# Patient Record
Sex: Female | Born: 1955
Health system: Southern US, Community
[De-identification: ages and names within clinical notes are randomized; demographics above are authoritative.]

## PROBLEM LIST (undated history)

## (undated) ENCOUNTER — Ambulatory Visit (HOSPITAL_COMMUNITY): Payer: Self-pay

## (undated) DIAGNOSIS — R569 Unspecified convulsions: Secondary | ICD-10-CM

## (undated) HISTORY — PX: TUBAL LIGATION: SHX77

## (undated) HISTORY — PX: SHOULDER ACROMIOPLASTY: SHX6093

## (undated) HISTORY — DX: Unspecified convulsions: R56.9

---

## 1999-05-03 ENCOUNTER — Emergency Department (HOSPITAL_COMMUNITY): Admission: EM | Admit: 1999-05-03 | Discharge: 1999-05-03 | Payer: Self-pay | Admitting: Emergency Medicine

## 1999-05-03 ENCOUNTER — Encounter: Payer: Self-pay | Admitting: Emergency Medicine

## 2000-07-28 ENCOUNTER — Encounter: Payer: Self-pay | Admitting: Orthopedic Surgery

## 2000-07-28 ENCOUNTER — Encounter: Admission: RE | Admit: 2000-07-28 | Discharge: 2000-07-28 | Payer: Self-pay | Admitting: Orthopedic Surgery

## 2000-08-23 ENCOUNTER — Ambulatory Visit (HOSPITAL_BASED_OUTPATIENT_CLINIC_OR_DEPARTMENT_OTHER): Admission: RE | Admit: 2000-08-23 | Discharge: 2000-08-23 | Payer: Self-pay | Admitting: Orthopedic Surgery

## 2001-12-19 ENCOUNTER — Encounter: Payer: Self-pay | Admitting: Family Medicine

## 2001-12-19 ENCOUNTER — Encounter: Admission: RE | Admit: 2001-12-19 | Discharge: 2001-12-19 | Payer: Self-pay | Admitting: Family Medicine

## 2003-01-21 ENCOUNTER — Encounter: Admission: RE | Admit: 2003-01-21 | Discharge: 2003-01-21 | Payer: Self-pay | Admitting: Family Medicine

## 2003-01-21 ENCOUNTER — Encounter: Payer: Self-pay | Admitting: Family Medicine

## 2004-02-09 ENCOUNTER — Other Ambulatory Visit: Admission: RE | Admit: 2004-02-09 | Discharge: 2004-02-09 | Payer: Self-pay | Admitting: Family Medicine

## 2004-02-20 ENCOUNTER — Encounter: Admission: RE | Admit: 2004-02-20 | Discharge: 2004-02-20 | Payer: Self-pay | Admitting: Family Medicine

## 2004-05-20 ENCOUNTER — Ambulatory Visit (HOSPITAL_COMMUNITY): Admission: RE | Admit: 2004-05-20 | Discharge: 2004-05-20 | Payer: Self-pay | Admitting: Family Medicine

## 2005-02-14 ENCOUNTER — Other Ambulatory Visit: Admission: RE | Admit: 2005-02-14 | Discharge: 2005-02-14 | Payer: Self-pay | Admitting: Family Medicine

## 2006-02-16 ENCOUNTER — Other Ambulatory Visit: Admission: RE | Admit: 2006-02-16 | Discharge: 2006-02-16 | Payer: Self-pay | Admitting: Family Medicine

## 2006-06-02 ENCOUNTER — Encounter: Admission: RE | Admit: 2006-06-02 | Discharge: 2006-06-02 | Payer: Self-pay | Admitting: Family Medicine

## 2009-04-14 ENCOUNTER — Encounter: Admission: RE | Admit: 2009-04-14 | Discharge: 2009-04-14 | Payer: Self-pay | Admitting: Family Medicine

## 2009-06-09 ENCOUNTER — Other Ambulatory Visit: Admission: RE | Admit: 2009-06-09 | Discharge: 2009-06-09 | Payer: Self-pay | Admitting: Family Medicine

## 2011-06-20 ENCOUNTER — Other Ambulatory Visit: Payer: Self-pay | Admitting: Family Medicine

## 2011-06-20 DIAGNOSIS — Z1231 Encounter for screening mammogram for malignant neoplasm of breast: Secondary | ICD-10-CM

## 2011-07-01 ENCOUNTER — Ambulatory Visit: Payer: Self-pay

## 2011-07-08 ENCOUNTER — Ambulatory Visit
Admission: RE | Admit: 2011-07-08 | Discharge: 2011-07-08 | Disposition: A | Discharge status: Home / Self Care | Payer: Commercial Indemnity | Source: Ambulatory Visit | Attending: Family Medicine | Admitting: Family Medicine

## 2011-07-08 DIAGNOSIS — Z1231 Encounter for screening mammogram for malignant neoplasm of breast: Secondary | ICD-10-CM

## 2013-02-11 ENCOUNTER — Other Ambulatory Visit: Payer: Self-pay

## 2013-02-11 DIAGNOSIS — Z1231 Encounter for screening mammogram for malignant neoplasm of breast: Secondary | ICD-10-CM

## 2013-03-12 ENCOUNTER — Ambulatory Visit
Admission: RE | Admit: 2013-03-12 | Discharge: 2013-03-12 | Disposition: A | Discharge status: Home / Self Care | Payer: BC Managed Care – PPO | Source: Ambulatory Visit

## 2013-03-12 DIAGNOSIS — Z1231 Encounter for screening mammogram for malignant neoplasm of breast: Secondary | ICD-10-CM

## 2013-04-18 ENCOUNTER — Other Ambulatory Visit (HOSPITAL_COMMUNITY)
Admission: RE | Admit: 2013-04-18 | Discharge: 2013-04-18 | Disposition: A | Discharge status: Home / Self Care | Payer: BC Managed Care – PPO | Source: Ambulatory Visit | Attending: Family Medicine | Admitting: Family Medicine

## 2013-04-18 ENCOUNTER — Other Ambulatory Visit: Payer: Self-pay | Admitting: Family Medicine

## 2013-04-18 DIAGNOSIS — Z113 Encounter for screening for infections with a predominantly sexual mode of transmission: Secondary | ICD-10-CM | POA: Insufficient documentation

## 2013-04-18 DIAGNOSIS — Z0000 Encounter for general adult medical examination without abnormal findings: Secondary | ICD-10-CM | POA: Insufficient documentation

## 2013-04-18 DIAGNOSIS — R87810 Cervical high risk human papillomavirus (HPV) DNA test positive: Secondary | ICD-10-CM | POA: Insufficient documentation

## 2013-04-18 DIAGNOSIS — Z1151 Encounter for screening for human papillomavirus (HPV): Secondary | ICD-10-CM | POA: Insufficient documentation

## 2013-10-23 ENCOUNTER — Ambulatory Visit (INDEPENDENT_AMBULATORY_CARE_PROVIDER_SITE_OTHER): Payer: BC Managed Care – PPO

## 2013-10-23 VITALS — BP 150/80 | HR 64 | Resp 12 | Ht 60.0 in | Wt 157.0 lb

## 2013-10-23 DIAGNOSIS — R52 Pain, unspecified: Secondary | ICD-10-CM

## 2013-10-23 DIAGNOSIS — M2010 Hallux valgus (acquired), unspecified foot: Secondary | ICD-10-CM

## 2013-10-23 DIAGNOSIS — M779 Enthesopathy, unspecified: Secondary | ICD-10-CM

## 2013-10-23 DIAGNOSIS — M205X2 Other deformities of toe(s) (acquired), left foot: Secondary | ICD-10-CM

## 2013-10-23 DIAGNOSIS — M205X9 Other deformities of toe(s) (acquired), unspecified foot: Secondary | ICD-10-CM

## 2013-10-23 MED ORDER — MELOXICAM 15 MG PO TABS: 15.0000 mg | ORAL_TABLET | Freq: Every day | ORAL | Status: DC

## 2013-10-23 NOTE — Patient Instructions (Signed)

## 2013-10-23 NOTE — Progress Notes (Signed)
   Subjective:    Patient ID: Laura Baldwin, female    DOB: [birth year], 58 y.o.   MRN: [id number]  Foot Pain This is a new problem. The current episode started more than 1 month ago. The problem occurs constantly. The problem has been unchanged. The symptoms are aggravated by walking and standing. She has tried NSAIDs and ice for the symptoms. The treatment provided mild relief.      Review of Systems  Neurological: Positive for seizures.  All other systems reviewed and are negative.       Objective:   Physical Exam Lower extremity objective findings as follows vascular status is intact with pedal pulses palpable DP and PT posterior were for bilateral Refill time 3 seconds all digits skin temperature warm turgor normal no edema rubor pallor or varicosities noted. Neurologically epicritic and proprioceptive sensations intact and symmetric bilateral normal plantar response and DTRs. Dermatologically skin color pigment normal hair growth absent nails normal trophic no open wounds or ulcerations noted. On orthopedic biomechanical exam rectus foot type with mild promontory changes noted on weightbearing there is a slight hallux abductus HAV deformity noted bilateral left more so than right. There is some decreased range of motion dorsiflexion plantar flexion at the left first MTP area as compared to the right mild flexible digital contractures lesser digits are noted. X-rays reveal some asymmetric joint space narrowing first MTP joint right sesamoid position 4 mild spurring at the first metatarsal neck and base of the phalanx first metatarsal bone toe joint left. Slightly elevated I am angle approximately 11-12. Clinically there is pain on palpation around first MTP joint although minimal more severe with prolonged walking activities certain shoes.       Assessment & Plan:  Assessment this time is capsulitis first MTP area left foot secondary to functional hallux limitus and promontory  changes of the foot. Patient is mild early HAV deformity again with capsulitis symptoms in pain with activities. Plan at this time patient placed a dancers. The fascial strapping. Also a prescription for Mobic is dispensed 15 mg daily. Also suggested ice to the affected area and wide accommodative shoes as tolerated recheck in 2-3 weeks for reevaluation patient may be candidate for orthoses possible orthotic with kinetic wedge type modification.  Alvan Dameichard Maryln Eastham DPM

## 2013-11-01 ENCOUNTER — Other Ambulatory Visit: Payer: Self-pay | Admitting: Obstetrics and Gynecology

## 2013-11-01 ENCOUNTER — Other Ambulatory Visit (HOSPITAL_COMMUNITY)
Admission: RE | Admit: 2013-11-01 | Discharge: 2013-11-01 | Disposition: A | Discharge status: Home / Self Care | Payer: BC Managed Care – PPO | Source: Ambulatory Visit | Attending: Obstetrics and Gynecology | Admitting: Obstetrics and Gynecology

## 2013-11-01 DIAGNOSIS — Z1151 Encounter for screening for human papillomavirus (HPV): Secondary | ICD-10-CM | POA: Insufficient documentation

## 2013-11-01 DIAGNOSIS — R87810 Cervical high risk human papillomavirus (HPV) DNA test positive: Secondary | ICD-10-CM | POA: Insufficient documentation

## 2013-11-01 DIAGNOSIS — Z124 Encounter for screening for malignant neoplasm of cervix: Secondary | ICD-10-CM | POA: Insufficient documentation

## 2013-11-22 ENCOUNTER — Ambulatory Visit (INDEPENDENT_AMBULATORY_CARE_PROVIDER_SITE_OTHER): Payer: BC Managed Care – PPO

## 2013-11-22 ENCOUNTER — Ambulatory Visit: Payer: BC Managed Care – PPO

## 2013-11-22 VITALS — BP 140/83 | HR 96 | Resp 16

## 2013-11-22 DIAGNOSIS — M2010 Hallux valgus (acquired), unspecified foot: Secondary | ICD-10-CM

## 2013-11-22 DIAGNOSIS — M205X9 Other deformities of toe(s) (acquired), unspecified foot: Secondary | ICD-10-CM

## 2013-11-22 DIAGNOSIS — M205X2 Other deformities of toe(s) (acquired), left foot: Secondary | ICD-10-CM

## 2013-11-22 DIAGNOSIS — R52 Pain, unspecified: Secondary | ICD-10-CM

## 2013-11-22 DIAGNOSIS — M779 Enthesopathy, unspecified: Secondary | ICD-10-CM

## 2013-11-22 MED ORDER — MELOXICAM 15 MG PO TABS: 15.0000 mg | ORAL_TABLET | Freq: Every day | ORAL | Status: AC

## 2013-11-22 NOTE — Patient Instructions (Signed)

## 2013-11-22 NOTE — Progress Notes (Signed)
   Subjective:    Patient ID: Laura LivingsRebecca G. Ladona Ridgelaylor, female    DOB: [birth year], 58 y.o.   MRN: [id number]  HPI Comments: "Well, got a little better, but I still feel it some"  Follow up capsulitis 1st MPJ left foot  Foot Pain      Review of Systems no new changes or finding     Objective:   Physical Exam Neurovascular status is intact with pedal pulses palpable DP postal for PT plus one over 4 Refill time 3 seconds all digits there is still some tenderness of first MTP joint left foot have good range of motion noted slight hallux limitus is identified on dorsiflexion patient had improvement with the Mobic at this time I do feel that would benefit from a semi-functional or least over OTC orthotic patient is and I did not want this time will select an OTC power step orthotic as a trolley we can adjust and modify these the future as needed may consider some pocketing of the first metatarsal the future based on the       Assessment & Plan:  Assessment capsulitis secondary to hallux limitus/hallux rigidus deformity with HAV and osteoarthropathy first MTP area left foot plan at this time patient will continue with Mobic new prescription is forwarded to the pharmacy also use an as-needed basis for pain patient will also initiate power step OTC orthotic as instructed Balen Woolum instructions are given followup in one to 2 months for adjustments if needed  Alvan Dameichard Tekla Malachowski DPM

## 2014-01-21 ENCOUNTER — Ambulatory Visit (INDEPENDENT_AMBULATORY_CARE_PROVIDER_SITE_OTHER): Payer: BC Managed Care – PPO

## 2014-01-21 VITALS — BP 135/77 | HR 73 | Resp 16

## 2014-01-21 DIAGNOSIS — M205X9 Other deformities of toe(s) (acquired), unspecified foot: Secondary | ICD-10-CM

## 2014-01-21 DIAGNOSIS — M205X2 Other deformities of toe(s) (acquired), left foot: Secondary | ICD-10-CM

## 2014-01-21 DIAGNOSIS — M779 Enthesopathy, unspecified: Secondary | ICD-10-CM

## 2014-01-21 NOTE — Patient Instructions (Signed)
WEARING INSTRUCTIONS FOR ORTHOTICS  Don't expect to be comfortable wearing your orthotic devices for the first time.  Like eyeglasses, you may be aware of them as time passes, they will not be uncomfortable and you will enjoy wearing them.  FOLLOW THESE INSTRUCTIONS EXACTLY!  1. Wear your orthotic devices for:       Not more than 1 hour the first day.       Not more than 2 hours the second day.       Not more than 3 hours the third day and so on.        Or wear them for as long as they feel comfortable.       If you experience discomfort in your feet or legs take them out.  When feet & legs feel       better, put them back in.  You do need to be consistent and wear them a little        everyday. 2.   If at any time the orthotic devices become acutely uncomfortable before the       time for that particular day, STOP WEARING THEM. 3.   On the next day, do not increase the wearing time. 4.   Subsequently, increase the wearing time by 15-30 minutes only if comfortable to do       so. 5.   You will be seen by your doctor about 2-4 weeks after you receive your orthotic       devices, at which time you will probably be wearing your devices comfortably        for about 8 hours or more a day. 6.   Some patients occasionally report mild aches or discomfort in other parts of the of       body such as the knees, hips or back after 3 or 4 consecutive hours of wear.  If this       is the case with you, do not extend your wearing time.  Instead, cut it back an hour or       two.  In all likelihood, these symptoms will disappear in a short period of time as your       body posture realigns itself and functions more efficiently. 7.   It is possible that your orthotic device may require some small changes or adjustment       to improve their function or make them more comfortable.   This is usually not done       before one to three months have elapsed.  These adjustments are made in        accordance  with the changed position your feet are assuming as a result of       improved biomechanical function. 8.   In women's shoes, it's not unusual for your heel to slip out of the shoe, particularly if       they are step-in-shoes.  If this is the case, try other shoes or other styles.  Try to       purchase shoes which have deeper heal seats or higher heel counters. 9.   Squeaking of orthotics devices in the shoes is due to the movement of the devices       when they are functioning normally.  To eliminate squeaking, simply dust some       baby powder into your shoes before inserting the devices.  If this does not work,          apply soap or wax to the edges of the orthotic devices or put a tissue into the shoes. 10. It is important that you follow these directions explicitly.  Failure to do so will simply       prolong the adjustment period or create problems which are easily avoided.  It makes       no difference if you are wearing your orthotic devices for only a few hours after        several months, so long as you are wearing them comfortably for those hours. 11. If you have any questions or complaints, contact our office.  We have no way of       knowing about your problems unless you tell us.  If we do not hear from you, we will       assume that you are proceeding well.  Maintain use orthotics at some time pain or symptomology becomes worse followup for further evaluation may need more invasive or surgical options

## 2014-01-21 NOTE — Progress Notes (Signed)
   Subjective:    Patient ID: Laura LivingsRebecca G. Ladona Baldwin, female    DOB: [birth year], 58 y.o.   MRN: [id number]  HPI Comments: "It is better, but not where I would like it to be"  Follow up capsulitis 1st MPJ left foot  Foot Pain      Review of Systems no new systemic changes or findings noted     Objective:   Physical Exam Neurovascular status is intact pedal pulses are palpable epicritic and proprioceptive sensations intact Norplant response and DTRs patient continues to have some limited range of motion of the first MTP joint cover the power step orthotics have been beneficial has lacerated has less pain on palpation or range of motion there is some dorsal spurring first metatarsal consistent with previous evaluations however has minimal discomfort for the most part functions well which is wearing a good shoe in the orthoses. At this time suggested continue monitoring could be months to be years to the point where the symptoms improve increase or 4 strain to consider surgical or invasive options.       Assessment & Plan:  Assessment hallux limitus/rigidus deformity with capsulitis first MTP area left foot plan at this time continue with orthotics maintain good supportive athletic or walking shoes at all times if symptoms persist or worsen over time may be candidate for surgical intervention tylectomy or osteotomy with cheilectomy first MTP area left foot. Maintain orthotics at all times with instructions be given at this time  Alvan Dameichard Malinda Mayden DPM

## 2014-04-24 ENCOUNTER — Other Ambulatory Visit (HOSPITAL_COMMUNITY)
Admission: RE | Admit: 2014-04-24 | Discharge: 2014-04-24 | Disposition: A | Discharge status: Home / Self Care | Payer: BC Managed Care – PPO | Source: Ambulatory Visit | Attending: Obstetrics and Gynecology | Admitting: Obstetrics and Gynecology

## 2014-04-24 ENCOUNTER — Other Ambulatory Visit: Payer: Self-pay | Admitting: Obstetrics and Gynecology

## 2014-04-24 DIAGNOSIS — Z124 Encounter for screening for malignant neoplasm of cervix: Secondary | ICD-10-CM | POA: Insufficient documentation

## 2014-04-24 DIAGNOSIS — Z1151 Encounter for screening for human papillomavirus (HPV): Secondary | ICD-10-CM | POA: Insufficient documentation

## 2014-04-24 DIAGNOSIS — R87810 Cervical high risk human papillomavirus (HPV) DNA test positive: Secondary | ICD-10-CM | POA: Insufficient documentation

## 2014-04-28 LAB — CYTOLOGY - PAP

## 2014-05-27 ENCOUNTER — Other Ambulatory Visit: Payer: Self-pay | Admitting: Obstetrics and Gynecology

## 2014-05-30 ENCOUNTER — Other Ambulatory Visit: Payer: Self-pay

## 2014-05-30 DIAGNOSIS — Z1231 Encounter for screening mammogram for malignant neoplasm of breast: Secondary | ICD-10-CM

## 2014-06-27 ENCOUNTER — Ambulatory Visit
Admission: RE | Admit: 2014-06-27 | Discharge: 2014-06-27 | Disposition: A | Discharge status: Home / Self Care | Payer: BC Managed Care – PPO | Source: Ambulatory Visit

## 2014-06-27 DIAGNOSIS — Z1231 Encounter for screening mammogram for malignant neoplasm of breast: Secondary | ICD-10-CM

## 2015-04-24 ENCOUNTER — Other Ambulatory Visit: Payer: Self-pay | Admitting: Obstetrics and Gynecology

## 2015-04-24 ENCOUNTER — Other Ambulatory Visit (HOSPITAL_COMMUNITY)
Admission: RE | Admit: 2015-04-24 | Discharge: 2015-04-24 | Disposition: A | Discharge status: Home / Self Care | Payer: BLUE CROSS/BLUE SHIELD | Source: Ambulatory Visit | Attending: Obstetrics and Gynecology | Admitting: Obstetrics and Gynecology

## 2015-04-24 DIAGNOSIS — R87810 Cervical high risk human papillomavirus (HPV) DNA test positive: Secondary | ICD-10-CM | POA: Insufficient documentation

## 2015-04-24 DIAGNOSIS — Z1151 Encounter for screening for human papillomavirus (HPV): Secondary | ICD-10-CM | POA: Diagnosis present

## 2015-04-24 DIAGNOSIS — Z01411 Encounter for gynecological examination (general) (routine) with abnormal findings: Secondary | ICD-10-CM | POA: Diagnosis present

## 2015-04-27 LAB — CYTOLOGY - PAP

## 2015-10-29 ENCOUNTER — Other Ambulatory Visit (HOSPITAL_COMMUNITY)
Admission: RE | Admit: 2015-10-29 | Discharge: 2015-10-29 | Disposition: A | Discharge status: Home / Self Care | Payer: BLUE CROSS/BLUE SHIELD | Source: Ambulatory Visit | Attending: Obstetrics and Gynecology | Admitting: Obstetrics and Gynecology

## 2015-10-29 ENCOUNTER — Other Ambulatory Visit: Payer: Self-pay | Admitting: Obstetrics and Gynecology

## 2015-10-29 DIAGNOSIS — Z1151 Encounter for screening for human papillomavirus (HPV): Secondary | ICD-10-CM | POA: Diagnosis not present

## 2015-10-29 DIAGNOSIS — Z01411 Encounter for gynecological examination (general) (routine) with abnormal findings: Secondary | ICD-10-CM | POA: Insufficient documentation

## 2015-10-30 LAB — CYTOLOGY - PAP

## 2016-02-23 ENCOUNTER — Other Ambulatory Visit: Payer: Self-pay | Admitting: Obstetrics and Gynecology

## 2016-03-02 ENCOUNTER — Other Ambulatory Visit: Payer: Self-pay | Admitting: Obstetrics and Gynecology

## 2016-03-02 DIAGNOSIS — Z1231 Encounter for screening mammogram for malignant neoplasm of breast: Secondary | ICD-10-CM

## 2016-03-31 ENCOUNTER — Ambulatory Visit
Admission: RE | Admit: 2016-03-31 | Discharge: 2016-03-31 | Disposition: A | Discharge status: Home / Self Care | Payer: BLUE CROSS/BLUE SHIELD | Source: Ambulatory Visit | Attending: Obstetrics and Gynecology | Admitting: Obstetrics and Gynecology

## 2016-03-31 DIAGNOSIS — Z1231 Encounter for screening mammogram for malignant neoplasm of breast: Secondary | ICD-10-CM

## 2016-05-06 ENCOUNTER — Other Ambulatory Visit: Payer: Self-pay | Admitting: Obstetrics and Gynecology

## 2016-05-06 ENCOUNTER — Other Ambulatory Visit (HOSPITAL_COMMUNITY)
Admission: RE | Admit: 2016-05-06 | Discharge: 2016-05-06 | Disposition: A | Discharge status: Home / Self Care | Payer: BLUE CROSS/BLUE SHIELD | Source: Ambulatory Visit | Attending: Obstetrics and Gynecology | Admitting: Obstetrics and Gynecology

## 2016-05-06 DIAGNOSIS — Z1151 Encounter for screening for human papillomavirus (HPV): Secondary | ICD-10-CM | POA: Diagnosis present

## 2016-05-06 DIAGNOSIS — Z01419 Encounter for gynecological examination (general) (routine) without abnormal findings: Secondary | ICD-10-CM | POA: Diagnosis present

## 2016-05-10 LAB — CYTOLOGY - PAP

## 2017-05-08 ENCOUNTER — Other Ambulatory Visit: Payer: Self-pay | Admitting: Obstetrics and Gynecology

## 2017-05-08 ENCOUNTER — Other Ambulatory Visit (HOSPITAL_COMMUNITY)
Admission: RE | Admit: 2017-05-08 | Discharge: 2017-05-08 | Disposition: A | Discharge status: Home / Self Care | Payer: BLUE CROSS/BLUE SHIELD | Source: Ambulatory Visit | Attending: Obstetrics and Gynecology | Admitting: Obstetrics and Gynecology

## 2017-05-08 DIAGNOSIS — M8580 Other specified disorders of bone density and structure, unspecified site: Secondary | ICD-10-CM | POA: Diagnosis not present

## 2017-05-08 DIAGNOSIS — M810 Age-related osteoporosis without current pathological fracture: Secondary | ICD-10-CM | POA: Diagnosis not present

## 2017-05-08 DIAGNOSIS — Z124 Encounter for screening for malignant neoplasm of cervix: Secondary | ICD-10-CM | POA: Insufficient documentation

## 2017-05-08 DIAGNOSIS — Z01419 Encounter for gynecological examination (general) (routine) without abnormal findings: Secondary | ICD-10-CM | POA: Diagnosis not present

## 2017-05-08 DIAGNOSIS — Z01411 Encounter for gynecological examination (general) (routine) with abnormal findings: Secondary | ICD-10-CM | POA: Diagnosis not present

## 2017-05-10 LAB — CYTOLOGY - PAP
Diagnosis: UNDETERMINED — AB
HPV 16/18/45 genotyping: POSITIVE — AB
HPV: DETECTED — AB

## 2017-06-05 ENCOUNTER — Other Ambulatory Visit: Payer: Self-pay | Admitting: Obstetrics and Gynecology

## 2017-06-05 DIAGNOSIS — Z1231 Encounter for screening mammogram for malignant neoplasm of breast: Secondary | ICD-10-CM

## 2017-06-16 DIAGNOSIS — Z23 Encounter for immunization: Secondary | ICD-10-CM | POA: Diagnosis not present

## 2017-06-16 DIAGNOSIS — E785 Hyperlipidemia, unspecified: Secondary | ICD-10-CM | POA: Diagnosis not present

## 2017-06-16 DIAGNOSIS — G40909 Epilepsy, unspecified, not intractable, without status epilepticus: Secondary | ICD-10-CM | POA: Diagnosis not present

## 2017-06-16 DIAGNOSIS — E559 Vitamin D deficiency, unspecified: Secondary | ICD-10-CM | POA: Diagnosis not present

## 2017-07-20 ENCOUNTER — Other Ambulatory Visit: Payer: Self-pay | Admitting: Obstetrics and Gynecology

## 2017-07-20 ENCOUNTER — Ambulatory Visit
Admission: RE | Admit: 2017-07-20 | Discharge: 2017-07-20 | Disposition: A | Discharge status: Home / Self Care | Payer: BLUE CROSS/BLUE SHIELD | Source: Ambulatory Visit | Attending: Obstetrics and Gynecology | Admitting: Obstetrics and Gynecology

## 2017-07-20 DIAGNOSIS — R87610 Atypical squamous cells of undetermined significance on cytologic smear of cervix (ASC-US): Secondary | ICD-10-CM | POA: Diagnosis not present

## 2017-07-20 DIAGNOSIS — A630 Anogenital (venereal) warts: Secondary | ICD-10-CM | POA: Diagnosis not present

## 2017-07-20 DIAGNOSIS — Z1231 Encounter for screening mammogram for malignant neoplasm of breast: Secondary | ICD-10-CM

## 2017-08-25 ENCOUNTER — Other Ambulatory Visit: Payer: Self-pay | Admitting: Obstetrics and Gynecology

## 2017-08-25 DIAGNOSIS — R87810 Cervical high risk human papillomavirus (HPV) DNA test positive: Secondary | ICD-10-CM | POA: Diagnosis not present

## 2017-08-25 DIAGNOSIS — N888 Other specified noninflammatory disorders of cervix uteri: Secondary | ICD-10-CM | POA: Diagnosis not present

## 2017-10-02 DIAGNOSIS — N898 Other specified noninflammatory disorders of vagina: Secondary | ICD-10-CM | POA: Diagnosis not present

## 2017-10-02 DIAGNOSIS — Z9889 Other specified postprocedural states: Secondary | ICD-10-CM | POA: Diagnosis not present

## 2018-02-22 DIAGNOSIS — N3001 Acute cystitis with hematuria: Secondary | ICD-10-CM | POA: Diagnosis not present

## 2018-05-08 ENCOUNTER — Other Ambulatory Visit: Payer: Self-pay | Admitting: Obstetrics and Gynecology

## 2018-05-08 DIAGNOSIS — Z1231 Encounter for screening mammogram for malignant neoplasm of breast: Secondary | ICD-10-CM

## 2018-06-22 DIAGNOSIS — E785 Hyperlipidemia, unspecified: Secondary | ICD-10-CM | POA: Diagnosis not present

## 2018-06-22 DIAGNOSIS — Z23 Encounter for immunization: Secondary | ICD-10-CM | POA: Diagnosis not present

## 2018-06-22 DIAGNOSIS — G40909 Epilepsy, unspecified, not intractable, without status epilepticus: Secondary | ICD-10-CM | POA: Diagnosis not present

## 2018-06-22 DIAGNOSIS — E559 Vitamin D deficiency, unspecified: Secondary | ICD-10-CM | POA: Diagnosis not present

## 2018-08-06 ENCOUNTER — Other Ambulatory Visit (HOSPITAL_COMMUNITY)
Admission: RE | Admit: 2018-08-06 | Discharge: 2018-08-06 | Disposition: A | Discharge status: Home / Self Care | Payer: BLUE CROSS/BLUE SHIELD | Source: Ambulatory Visit | Attending: Obstetrics and Gynecology | Admitting: Obstetrics and Gynecology

## 2018-08-06 ENCOUNTER — Ambulatory Visit
Admission: RE | Admit: 2018-08-06 | Discharge: 2018-08-06 | Disposition: A | Discharge status: Home / Self Care | Payer: BLUE CROSS/BLUE SHIELD | Source: Ambulatory Visit | Attending: Obstetrics and Gynecology | Admitting: Obstetrics and Gynecology

## 2018-08-06 ENCOUNTER — Other Ambulatory Visit: Payer: Self-pay | Admitting: Obstetrics and Gynecology

## 2018-08-06 DIAGNOSIS — R32 Unspecified urinary incontinence: Secondary | ICD-10-CM | POA: Diagnosis not present

## 2018-08-06 DIAGNOSIS — R87810 Cervical high risk human papillomavirus (HPV) DNA test positive: Secondary | ICD-10-CM | POA: Diagnosis not present

## 2018-08-06 DIAGNOSIS — Z1231 Encounter for screening mammogram for malignant neoplasm of breast: Secondary | ICD-10-CM | POA: Diagnosis not present

## 2018-08-06 DIAGNOSIS — Z01411 Encounter for gynecological examination (general) (routine) with abnormal findings: Secondary | ICD-10-CM | POA: Diagnosis not present

## 2018-08-06 DIAGNOSIS — R300 Dysuria: Secondary | ICD-10-CM | POA: Diagnosis not present

## 2018-08-06 DIAGNOSIS — Z113 Encounter for screening for infections with a predominantly sexual mode of transmission: Secondary | ICD-10-CM | POA: Diagnosis not present

## 2018-08-06 DIAGNOSIS — A6000 Herpesviral infection of urogenital system, unspecified: Secondary | ICD-10-CM | POA: Diagnosis not present

## 2018-08-08 LAB — CYTOLOGY - PAP
Adequacy: ABSENT — AB
Chlamydia: NEGATIVE
HPV 16/18/45 genotyping: POSITIVE — AB
HPV: DETECTED — AB
Neisseria Gonorrhea: NEGATIVE

## 2019-06-21 DIAGNOSIS — Z23 Encounter for immunization: Secondary | ICD-10-CM | POA: Diagnosis not present

## 2019-06-21 DIAGNOSIS — G40909 Epilepsy, unspecified, not intractable, without status epilepticus: Secondary | ICD-10-CM | POA: Diagnosis not present

## 2019-06-21 DIAGNOSIS — E559 Vitamin D deficiency, unspecified: Secondary | ICD-10-CM | POA: Diagnosis not present

## 2019-06-21 DIAGNOSIS — E785 Hyperlipidemia, unspecified: Secondary | ICD-10-CM | POA: Diagnosis not present

## 2019-10-03 DIAGNOSIS — R896 Abnormal cytological findings in specimens from other organs, systems and tissues: Secondary | ICD-10-CM | POA: Diagnosis not present

## 2019-10-03 DIAGNOSIS — Z01411 Encounter for gynecological examination (general) (routine) with abnormal findings: Secondary | ICD-10-CM | POA: Diagnosis not present

## 2019-10-03 DIAGNOSIS — R87612 Low grade squamous intraepithelial lesion on cytologic smear of cervix (LGSIL): Secondary | ICD-10-CM | POA: Diagnosis not present

## 2019-10-03 DIAGNOSIS — R87810 Cervical high risk human papillomavirus (HPV) DNA test positive: Secondary | ICD-10-CM | POA: Diagnosis not present

## 2019-10-04 ENCOUNTER — Other Ambulatory Visit: Payer: Self-pay | Admitting: Obstetrics and Gynecology

## 2019-10-04 DIAGNOSIS — Z1231 Encounter for screening mammogram for malignant neoplasm of breast: Secondary | ICD-10-CM

## 2019-10-04 DIAGNOSIS — M810 Age-related osteoporosis without current pathological fracture: Secondary | ICD-10-CM

## 2019-11-06 ENCOUNTER — Other Ambulatory Visit: Payer: Self-pay | Admitting: Obstetrics and Gynecology

## 2019-11-06 DIAGNOSIS — N888 Other specified noninflammatory disorders of cervix uteri: Secondary | ICD-10-CM | POA: Diagnosis not present

## 2019-11-06 DIAGNOSIS — N870 Mild cervical dysplasia: Secondary | ICD-10-CM | POA: Diagnosis not present

## 2019-11-06 DIAGNOSIS — R87810 Cervical high risk human papillomavirus (HPV) DNA test positive: Secondary | ICD-10-CM | POA: Diagnosis not present

## 2019-11-06 DIAGNOSIS — R896 Abnormal cytological findings in specimens from other organs, systems and tissues: Secondary | ICD-10-CM | POA: Diagnosis not present

## 2019-12-20 ENCOUNTER — Ambulatory Visit
Admission: RE | Admit: 2019-12-20 | Discharge: 2019-12-20 | Disposition: A | Discharge status: Home / Self Care | Payer: BC Managed Care – PPO | Source: Ambulatory Visit | Attending: Obstetrics and Gynecology | Admitting: Obstetrics and Gynecology

## 2019-12-20 ENCOUNTER — Other Ambulatory Visit: Payer: Self-pay

## 2019-12-20 DIAGNOSIS — Z780 Asymptomatic menopausal state: Secondary | ICD-10-CM | POA: Diagnosis not present

## 2019-12-20 DIAGNOSIS — Z1231 Encounter for screening mammogram for malignant neoplasm of breast: Secondary | ICD-10-CM | POA: Diagnosis not present

## 2019-12-20 DIAGNOSIS — M810 Age-related osteoporosis without current pathological fracture: Secondary | ICD-10-CM | POA: Diagnosis not present

## 2019-12-20 DIAGNOSIS — M8588 Other specified disorders of bone density and structure, other site: Secondary | ICD-10-CM | POA: Diagnosis not present

## 2020-06-10 DIAGNOSIS — E559 Vitamin D deficiency, unspecified: Secondary | ICD-10-CM | POA: Diagnosis not present

## 2020-06-10 DIAGNOSIS — R69 Illness, unspecified: Secondary | ICD-10-CM | POA: Diagnosis not present

## 2020-06-10 DIAGNOSIS — E785 Hyperlipidemia, unspecified: Secondary | ICD-10-CM | POA: Diagnosis not present

## 2020-06-10 DIAGNOSIS — G40909 Epilepsy, unspecified, not intractable, without status epilepticus: Secondary | ICD-10-CM | POA: Diagnosis not present

## 2020-06-10 DIAGNOSIS — Z23 Encounter for immunization: Secondary | ICD-10-CM | POA: Diagnosis not present

## 2020-09-21 DIAGNOSIS — R519 Headache, unspecified: Secondary | ICD-10-CM | POA: Diagnosis not present

## 2020-09-21 DIAGNOSIS — R509 Fever, unspecified: Secondary | ICD-10-CM | POA: Diagnosis not present

## 2020-09-21 DIAGNOSIS — B349 Viral infection, unspecified: Secondary | ICD-10-CM | POA: Diagnosis not present

## 2020-09-21 DIAGNOSIS — R059 Cough, unspecified: Secondary | ICD-10-CM | POA: Diagnosis not present

## 2020-09-21 DIAGNOSIS — R5383 Other fatigue: Secondary | ICD-10-CM | POA: Diagnosis not present

## 2020-09-21 DIAGNOSIS — J329 Chronic sinusitis, unspecified: Secondary | ICD-10-CM | POA: Diagnosis not present

## 2020-09-21 DIAGNOSIS — H9201 Otalgia, right ear: Secondary | ICD-10-CM | POA: Diagnosis not present

## 2020-09-21 DIAGNOSIS — H9202 Otalgia, left ear: Secondary | ICD-10-CM | POA: Diagnosis not present

## 2020-09-21 DIAGNOSIS — R6883 Chills (without fever): Secondary | ICD-10-CM | POA: Diagnosis not present

## 2020-09-21 DIAGNOSIS — H6982 Other specified disorders of Eustachian tube, left ear: Secondary | ICD-10-CM | POA: Diagnosis not present

## 2020-09-21 DIAGNOSIS — R52 Pain, unspecified: Secondary | ICD-10-CM | POA: Diagnosis not present

## 2020-09-24 ENCOUNTER — Encounter (HOSPITAL_COMMUNITY): Payer: Self-pay

## 2020-09-24 ENCOUNTER — Other Ambulatory Visit: Payer: Self-pay

## 2020-09-24 ENCOUNTER — Emergency Department (HOSPITAL_COMMUNITY)
Admission: EM | Admit: 2020-09-24 | Discharge: 2020-09-25 | Disposition: A | Discharge status: Home / Self Care | Payer: Medicare HMO | Attending: Emergency Medicine | Admitting: Emergency Medicine

## 2020-09-24 DIAGNOSIS — Z79899 Other long term (current) drug therapy: Secondary | ICD-10-CM | POA: Diagnosis not present

## 2020-09-24 DIAGNOSIS — Z7982 Long term (current) use of aspirin: Secondary | ICD-10-CM | POA: Diagnosis not present

## 2020-09-24 DIAGNOSIS — R531 Weakness: Secondary | ICD-10-CM

## 2020-09-24 DIAGNOSIS — R059 Cough, unspecified: Secondary | ICD-10-CM | POA: Diagnosis present

## 2020-09-24 DIAGNOSIS — U071 COVID-19: Secondary | ICD-10-CM | POA: Insufficient documentation

## 2020-09-24 LAB — CBC WITH DIFFERENTIAL/PLATELET

## 2020-09-24 NOTE — ED Triage Notes (Signed)
Pt tested positive for Covid- pt here tonight for loss of appetite and fatigue. Pt appears in no distress.

## 2020-09-25 LAB — URINALYSIS, ROUTINE W REFLEX MICROSCOPIC
Bilirubin Urine: NEGATIVE
Glucose, UA: NEGATIVE mg/dL
Hgb urine dipstick: NEGATIVE
Ketones, ur: 5 mg/dL — AB
Leukocytes,Ua: NEGATIVE
Nitrite: NEGATIVE
Protein, ur: NEGATIVE mg/dL
Specific Gravity, Urine: 1.01 (ref 1.005–1.030)
pH: 6 (ref 5.0–8.0)

## 2020-09-25 LAB — CBC WITH DIFFERENTIAL/PLATELET
Abs Immature Granulocytes: 0.02 10*3/uL (ref 0.00–0.07)
Basophils Absolute: 0 10*3/uL (ref 0.0–0.1)
Basophils Relative: 0 %
Eosinophils Absolute: 0 10*3/uL (ref 0.0–0.5)
Eosinophils Relative: 0 %
HCT: 44.5 % (ref 36.0–46.0)
Hemoglobin: 15 g/dL (ref 12.0–15.0)
Immature Granulocytes: 1 %
Lymphocytes Relative: 41 %
Lymphs Abs: 1.6 10*3/uL (ref 0.7–4.0)
MCH: 33.4 pg (ref 26.0–34.0)
MCHC: 33.7 g/dL (ref 30.0–36.0)
MCV: 99.1 fL (ref 80.0–100.0)
Monocytes Absolute: 0.1 10*3/uL (ref 0.1–1.0)
Monocytes Relative: 3 %
Neutro Abs: 2.2 10*3/uL (ref 1.7–7.7)
Neutrophils Relative %: 55 %
Platelets: 255 10*3/uL (ref 150–400)
RBC: 4.49 MIL/uL (ref 3.87–5.11)
RDW: 12.2 % (ref 11.5–15.5)
WBC: 4 10*3/uL (ref 4.0–10.5)
nRBC: 0 % (ref 0.0–0.2)

## 2020-09-25 LAB — COMPREHENSIVE METABOLIC PANEL
ALT: 102 U/L — ABNORMAL HIGH (ref 0–44)
AST: 148 U/L — ABNORMAL HIGH (ref 15–41)
Albumin: 3.6 g/dL (ref 3.5–5.0)
Alkaline Phosphatase: 126 U/L (ref 38–126)
Anion gap: 12 (ref 5–15)
BUN: 8 mg/dL (ref 8–23)
CO2: 25 mmol/L (ref 22–32)
Calcium: 8.1 mg/dL — ABNORMAL LOW (ref 8.9–10.3)
Chloride: 95 mmol/L — ABNORMAL LOW (ref 98–111)
Creatinine, Ser: 0.65 mg/dL (ref 0.44–1.00)
GFR, Estimated: >60 mL/min (ref >60)
Glucose, Bld: 101 mg/dL — ABNORMAL HIGH (ref 70–99)
Potassium: 3.6 mmol/L (ref 3.5–5.1)
Sodium: 132 mmol/L — ABNORMAL LOW (ref 135–145)
Total Bilirubin: 0.2 mg/dL — ABNORMAL LOW (ref 0.3–1.2)
Total Protein: 7.4 g/dL (ref 6.5–8.1)

## 2020-09-25 MED ORDER — ONDANSETRON 8 MG PO TBDP: 8.0000 mg | ORAL_TABLET | Freq: Three times a day (TID) | ORAL | 0 refills | Status: AC | PRN

## 2020-09-25 MED ORDER — ONDANSETRON 8 MG PO TBDP
8.0000 mg | ORAL_TABLET | Freq: Once | ORAL | Status: AC
  Administered 2020-09-25: 8 mg via ORAL
  Filled 2020-09-25: qty 1

## 2020-09-25 NOTE — ED Provider Notes (Signed)
[facility] EMERGENCY DEPARTMENT Provider Note   CSN: [id number] Arrival date & time: 09/24/20  1945     History Chief Complaint  Patient presents with  . Fatigue    Doesn't feel like eating-Covid +    Laura Baldwin is a 64 y.o. female.  The history is provided by the patient.  Weakness Severity:  Moderate Onset quality:  Gradual Progression:  Unchanged Chronicity:  New Relieved by:  Nothing Worsened by:  Nothing Associated symptoms: cough and fever   Associated symptoms: no abdominal pain, no chest pain, no diarrhea and no vomiting   Patient tested positive with COVID-19 test on December 27.  She reports cough fevers and chills.  No active chest pain or shortness of breath this time.  She reports fatigue and decreased p.o. intake.  She has not had the MAB infusion yet      Past Medical History:  Diagnosis Date  . Seizures (HCC)     There are no problems to display for this patient.   Past Surgical History:  Procedure Laterality Date  . SHOULDER ACROMIOPLASTY    . TUBAL LIGATION       OB History   No obstetric history on file.     No family history on file.  Social History   Tobacco Use  . Smoking status: Never Smoker  . Smokeless tobacco: Never Used  Substance Use Topics  . Alcohol use: Yes  . Drug use: No    Home Medications Prior to Admission medications   Medication Sig Start Date End Date Taking? Authorizing Provider  aspirin 81 MG tablet Take 81 mg by mouth daily.    [provider]  meloxicam (MOBIC) 15 MG tablet Take 1 tablet (15 mg total) by mouth daily. 11/22/13   Alvan Dame, DPM  pravastatin (PRAVACHOL) 40 MG tablet Take 40 mg by mouth daily.    [provider]  primidone (MYSOLINE) 250 MG tablet Take 250 mg by mouth 4 (four) times daily.    [provider]  vitamin B-12 (CYANOCOBALAMIN) 100 MCG tablet Take 100 mcg by mouth daily.    [provider]    Allergies    Patient has no  known allergies.  Review of Systems   Review of Systems  Constitutional: Positive for chills, fatigue and fever.  Respiratory: Positive for cough.   Cardiovascular: Negative for chest pain.  Gastrointestinal: Negative for abdominal pain, diarrhea and vomiting.  Neurological: Positive for weakness.  All other systems reviewed and are negative.   Physical Exam Updated Vital Signs BP 101/67 (BP Location: Right Arm)   Pulse 81   Temp 98.8 F (37.1 C) (Oral)   Resp (!) 28   Ht 1.524 m (5')   Wt 72.6 kg   SpO2 94%   BMI 31.25 kg/m   Physical Exam CONSTITUTIONAL: Well developed/well nourished HEAD: Normocephalic/atraumatic EYES: EOMI/PERRL ENMT: Mucous membranes moist NECK: supple no meningeal signs SPINE/BACK:entire spine nontender CV: S1/S2 noted, no murmurs/rubs/gallops noted LUNGS: Brief crackles in each base, no distress noted, no hypoxia ABDOMEN: soft, nontender, no rebound or guarding, bowel sounds noted throughout abdomen GU:no cva tenderness NEURO: Pt is awake/alert/appropriate, moves all extremitiesx4.  No facial droop.   EXTREMITIES: pulses normal/equal, full ROM SKIN: warm, color normal PSYCH: no abnormalities of mood noted, alert and oriented to situation  ED Results / Procedures / Treatments   Labs (all labs ordered are listed, but only abnormal results are displayed) Labs Reviewed  COMPREHENSIVE METABOLIC PANEL - Abnormal; Notable  for the following components:      Result Value   Sodium 132 (*)    Chloride 95 (*)    Glucose, Bld 101 (*)    Calcium 8.1 (*)    AST 148 (*)    ALT 102 (*)    Total Bilirubin 0.2 (*)    All other components within normal limits  URINALYSIS, ROUTINE W REFLEX MICROSCOPIC - Abnormal; Notable for the following components:   Ketones, ur 5 (*)    All other components within normal limits  CBC WITH DIFFERENTIAL/PLATELET    EKG EKG Interpretation  Date/Time:  Friday September 25 2020 00:18:24 EST Ventricular Rate:  79 PR  Interval:    QRS Duration: 86 QT Interval:  359 QTC Calculation: 412 R Axis:   13 Text Interpretation: Sinus rhythm Low voltage, precordial leads Nonspecific T abnormalities, anterior leads No previous ECGs available Confirmed by Zadie Rhine ([id number]) on 09/25/2020 12:34:48 AM   Radiology No results found.  Procedures Procedures  Medications Ordered in ED Medications  ondansetron (ZOFRAN-ODT) disintegrating tablet 8 mg (8 mg Oral Given 09/25/20 0041)    ED Course  I have reviewed the triage vital signs and the nursing notes.  Pertinent labs  results that were available during my care of the patient were reviewed by me and considered in my medical decision making (see chart for details).    MDM Rules/Calculators/A&P                          1:21 AM Patient presents with fatigue and decreased appetite since being diagnosed with COVID-19.  Patient is in no acute distress.  She is not vomiting.  She is not hypoxic or in respiratory distress. I have placed a consult for outpatient monoclonal antibody infusion but advised this is a limited quantity at this time Patient is appropriate for discharge Final Clinical Impression(s) / ED Diagnoses Final diagnoses:  Weakness  COVID-19    Rx / DC Orders ED Discharge Orders    None       Zadie Rhine, MD 09/25/20 [phone]

## 2020-09-25 NOTE — ED Notes (Signed)
Patient discharged to home.  All discharge instructions reviewed.  Patient able to verbalize understanding via teachback method.  Ambulatory out of ED.  

## 2020-10-01 DIAGNOSIS — R531 Weakness: Secondary | ICD-10-CM | POA: Diagnosis not present

## 2020-10-01 DIAGNOSIS — J189 Pneumonia, unspecified organism: Secondary | ICD-10-CM | POA: Diagnosis not present

## 2020-10-01 DIAGNOSIS — R0902 Hypoxemia: Secondary | ICD-10-CM | POA: Diagnosis not present

## 2020-10-01 DIAGNOSIS — R0602 Shortness of breath: Secondary | ICD-10-CM | POA: Diagnosis not present

## 2020-10-01 DIAGNOSIS — R0600 Dyspnea, unspecified: Secondary | ICD-10-CM | POA: Diagnosis not present

## 2020-10-01 DIAGNOSIS — R0689 Other abnormalities of breathing: Secondary | ICD-10-CM | POA: Diagnosis not present

## 2020-10-01 DIAGNOSIS — R7989 Other specified abnormal findings of blood chemistry: Secondary | ICD-10-CM | POA: Diagnosis not present

## 2020-10-01 DIAGNOSIS — Z743 Need for continuous supervision: Secondary | ICD-10-CM | POA: Diagnosis not present

## 2020-10-01 DIAGNOSIS — U071 COVID-19: Secondary | ICD-10-CM | POA: Diagnosis not present

## 2020-10-02 DIAGNOSIS — J9601 Acute respiratory failure with hypoxia: Secondary | ICD-10-CM | POA: Diagnosis not present

## 2020-10-02 DIAGNOSIS — I10 Essential (primary) hypertension: Secondary | ICD-10-CM | POA: Diagnosis not present

## 2020-10-02 DIAGNOSIS — Z6830 Body mass index (BMI) 30.0-30.9, adult: Secondary | ICD-10-CM | POA: Diagnosis not present

## 2020-10-02 DIAGNOSIS — E876 Hypokalemia: Secondary | ICD-10-CM | POA: Diagnosis not present

## 2020-10-02 DIAGNOSIS — U071 COVID-19: Secondary | ICD-10-CM | POA: Diagnosis not present

## 2020-10-02 DIAGNOSIS — E785 Hyperlipidemia, unspecified: Secondary | ICD-10-CM | POA: Diagnosis not present

## 2020-10-02 DIAGNOSIS — K449 Diaphragmatic hernia without obstruction or gangrene: Secondary | ICD-10-CM | POA: Diagnosis not present

## 2020-10-02 DIAGNOSIS — J1282 Pneumonia due to coronavirus disease 2019: Secondary | ICD-10-CM | POA: Diagnosis not present

## 2020-10-02 DIAGNOSIS — E669 Obesity, unspecified: Secondary | ICD-10-CM | POA: Diagnosis not present

## 2020-10-02 DIAGNOSIS — R0902 Hypoxemia: Secondary | ICD-10-CM | POA: Diagnosis not present

## 2020-10-02 DIAGNOSIS — J189 Pneumonia, unspecified organism: Secondary | ICD-10-CM | POA: Diagnosis not present

## 2020-10-02 DIAGNOSIS — R7989 Other specified abnormal findings of blood chemistry: Secondary | ICD-10-CM | POA: Diagnosis not present

## 2020-10-02 DIAGNOSIS — K760 Fatty (change of) liver, not elsewhere classified: Secondary | ICD-10-CM | POA: Diagnosis not present

## 2020-10-02 DIAGNOSIS — R0602 Shortness of breath: Secondary | ICD-10-CM | POA: Diagnosis not present

## 2020-10-02 DIAGNOSIS — R748 Abnormal levels of other serum enzymes: Secondary | ICD-10-CM | POA: Diagnosis not present

## 2020-10-02 DIAGNOSIS — R0600 Dyspnea, unspecified: Secondary | ICD-10-CM | POA: Diagnosis not present

## 2020-10-02 DIAGNOSIS — E7800 Pure hypercholesterolemia, unspecified: Secondary | ICD-10-CM | POA: Diagnosis not present

## 2020-10-02 DIAGNOSIS — R945 Abnormal results of liver function studies: Secondary | ICD-10-CM | POA: Diagnosis not present

## 2020-10-03 DIAGNOSIS — U071 COVID-19: Secondary | ICD-10-CM | POA: Diagnosis not present

## 2020-10-03 DIAGNOSIS — R7989 Other specified abnormal findings of blood chemistry: Secondary | ICD-10-CM | POA: Diagnosis not present

## 2020-10-03 DIAGNOSIS — J9601 Acute respiratory failure with hypoxia: Secondary | ICD-10-CM | POA: Diagnosis not present

## 2020-10-03 DIAGNOSIS — J1282 Pneumonia due to coronavirus disease 2019: Secondary | ICD-10-CM | POA: Diagnosis not present

## 2020-10-04 DIAGNOSIS — J9601 Acute respiratory failure with hypoxia: Secondary | ICD-10-CM | POA: Diagnosis not present

## 2020-10-04 DIAGNOSIS — E876 Hypokalemia: Secondary | ICD-10-CM | POA: Diagnosis not present

## 2020-10-04 DIAGNOSIS — E669 Obesity, unspecified: Secondary | ICD-10-CM | POA: Diagnosis not present

## 2020-10-04 DIAGNOSIS — R945 Abnormal results of liver function studies: Secondary | ICD-10-CM | POA: Diagnosis not present

## 2020-10-04 DIAGNOSIS — U071 COVID-19: Secondary | ICD-10-CM | POA: Diagnosis not present

## 2020-10-04 DIAGNOSIS — J1282 Pneumonia due to coronavirus disease 2019: Secondary | ICD-10-CM | POA: Diagnosis not present

## 2020-10-05 DIAGNOSIS — U071 COVID-19: Secondary | ICD-10-CM | POA: Diagnosis not present

## 2020-10-05 DIAGNOSIS — J9601 Acute respiratory failure with hypoxia: Secondary | ICD-10-CM | POA: Diagnosis not present

## 2020-10-05 DIAGNOSIS — R7989 Other specified abnormal findings of blood chemistry: Secondary | ICD-10-CM | POA: Diagnosis not present

## 2020-10-05 DIAGNOSIS — J1282 Pneumonia due to coronavirus disease 2019: Secondary | ICD-10-CM | POA: Diagnosis not present

## 2020-10-05 DIAGNOSIS — E669 Obesity, unspecified: Secondary | ICD-10-CM | POA: Diagnosis not present

## 2020-10-06 DIAGNOSIS — J1282 Pneumonia due to coronavirus disease 2019: Secondary | ICD-10-CM | POA: Diagnosis not present

## 2020-10-06 DIAGNOSIS — U071 COVID-19: Secondary | ICD-10-CM | POA: Diagnosis not present

## 2020-10-06 DIAGNOSIS — E669 Obesity, unspecified: Secondary | ICD-10-CM | POA: Diagnosis not present

## 2020-10-06 DIAGNOSIS — J9601 Acute respiratory failure with hypoxia: Secondary | ICD-10-CM | POA: Diagnosis not present

## 2020-10-07 DIAGNOSIS — J1282 Pneumonia due to coronavirus disease 2019: Secondary | ICD-10-CM | POA: Diagnosis not present

## 2020-10-07 DIAGNOSIS — U071 COVID-19: Secondary | ICD-10-CM | POA: Diagnosis not present

## 2020-10-07 DIAGNOSIS — J9601 Acute respiratory failure with hypoxia: Secondary | ICD-10-CM | POA: Diagnosis not present

## 2020-10-08 DIAGNOSIS — J1282 Pneumonia due to coronavirus disease 2019: Secondary | ICD-10-CM | POA: Diagnosis not present

## 2020-10-08 DIAGNOSIS — U071 COVID-19: Secondary | ICD-10-CM | POA: Diagnosis not present

## 2020-10-08 DIAGNOSIS — J9601 Acute respiratory failure with hypoxia: Secondary | ICD-10-CM | POA: Diagnosis not present

## 2020-10-09 DIAGNOSIS — J1282 Pneumonia due to coronavirus disease 2019: Secondary | ICD-10-CM | POA: Diagnosis not present

## 2020-10-09 DIAGNOSIS — U071 COVID-19: Secondary | ICD-10-CM | POA: Diagnosis not present

## 2020-10-09 DIAGNOSIS — J9601 Acute respiratory failure with hypoxia: Secondary | ICD-10-CM | POA: Diagnosis not present

## 2020-10-09 DIAGNOSIS — R0902 Hypoxemia: Secondary | ICD-10-CM | POA: Diagnosis not present

## 2020-10-10 DIAGNOSIS — J9601 Acute respiratory failure with hypoxia: Secondary | ICD-10-CM | POA: Diagnosis not present

## 2020-10-10 DIAGNOSIS — J1282 Pneumonia due to coronavirus disease 2019: Secondary | ICD-10-CM | POA: Diagnosis not present

## 2020-10-10 DIAGNOSIS — U071 COVID-19: Secondary | ICD-10-CM | POA: Diagnosis not present

## 2020-10-11 DIAGNOSIS — U071 COVID-19: Secondary | ICD-10-CM | POA: Diagnosis not present

## 2020-10-11 DIAGNOSIS — R0902 Hypoxemia: Secondary | ICD-10-CM | POA: Diagnosis not present

## 2020-10-11 DIAGNOSIS — J1282 Pneumonia due to coronavirus disease 2019: Secondary | ICD-10-CM | POA: Diagnosis not present

## 2020-10-12 DIAGNOSIS — J1282 Pneumonia due to coronavirus disease 2019: Secondary | ICD-10-CM | POA: Diagnosis not present

## 2020-10-12 DIAGNOSIS — J9601 Acute respiratory failure with hypoxia: Secondary | ICD-10-CM | POA: Diagnosis not present

## 2020-10-12 DIAGNOSIS — E669 Obesity, unspecified: Secondary | ICD-10-CM | POA: Diagnosis not present

## 2020-10-12 DIAGNOSIS — U071 COVID-19: Secondary | ICD-10-CM | POA: Diagnosis not present

## 2020-10-13 DIAGNOSIS — U071 COVID-19: Secondary | ICD-10-CM | POA: Diagnosis not present

## 2020-10-13 DIAGNOSIS — J1282 Pneumonia due to coronavirus disease 2019: Secondary | ICD-10-CM | POA: Diagnosis not present

## 2020-10-13 DIAGNOSIS — J9601 Acute respiratory failure with hypoxia: Secondary | ICD-10-CM | POA: Diagnosis not present

## 2020-10-13 DIAGNOSIS — E669 Obesity, unspecified: Secondary | ICD-10-CM | POA: Diagnosis not present

## 2020-10-14 DIAGNOSIS — J9601 Acute respiratory failure with hypoxia: Secondary | ICD-10-CM | POA: Diagnosis not present

## 2020-10-14 DIAGNOSIS — E669 Obesity, unspecified: Secondary | ICD-10-CM | POA: Diagnosis not present

## 2020-10-14 DIAGNOSIS — J1282 Pneumonia due to coronavirus disease 2019: Secondary | ICD-10-CM | POA: Diagnosis not present

## 2020-10-14 DIAGNOSIS — U071 COVID-19: Secondary | ICD-10-CM | POA: Diagnosis not present

## 2020-10-15 DIAGNOSIS — U071 COVID-19: Secondary | ICD-10-CM | POA: Diagnosis not present

## 2020-10-15 DIAGNOSIS — J1282 Pneumonia due to coronavirus disease 2019: Secondary | ICD-10-CM | POA: Diagnosis not present

## 2020-10-23 DIAGNOSIS — J1282 Pneumonia due to coronavirus disease 2019: Secondary | ICD-10-CM | POA: Diagnosis not present

## 2020-10-23 DIAGNOSIS — R7401 Elevation of levels of liver transaminase levels: Secondary | ICD-10-CM | POA: Diagnosis not present

## 2020-10-23 DIAGNOSIS — J9601 Acute respiratory failure with hypoxia: Secondary | ICD-10-CM | POA: Diagnosis not present

## 2020-10-23 DIAGNOSIS — U071 COVID-19: Secondary | ICD-10-CM | POA: Diagnosis not present

## 2020-10-23 DIAGNOSIS — R7982 Elevated C-reactive protein (CRP): Secondary | ICD-10-CM | POA: Diagnosis not present

## 2020-10-23 DIAGNOSIS — R748 Abnormal levels of other serum enzymes: Secondary | ICD-10-CM | POA: Diagnosis not present

## 2020-11-19 DIAGNOSIS — R748 Abnormal levels of other serum enzymes: Secondary | ICD-10-CM | POA: Diagnosis not present

## 2020-11-19 DIAGNOSIS — Z8616 Personal history of COVID-19: Secondary | ICD-10-CM | POA: Diagnosis not present

## 2021-01-15 ENCOUNTER — Other Ambulatory Visit: Payer: Self-pay | Admitting: Family Medicine

## 2021-01-15 DIAGNOSIS — Z1231 Encounter for screening mammogram for malignant neoplasm of breast: Secondary | ICD-10-CM

## 2021-03-05 ENCOUNTER — Ambulatory Visit
Admission: RE | Admit: 2021-03-05 | Discharge: 2021-03-05 | Disposition: A | Discharge status: Home / Self Care | Payer: Medicare HMO | Source: Ambulatory Visit | Attending: Family Medicine | Admitting: Family Medicine

## 2021-03-05 ENCOUNTER — Other Ambulatory Visit: Payer: Self-pay

## 2021-03-05 DIAGNOSIS — M810 Age-related osteoporosis without current pathological fracture: Secondary | ICD-10-CM | POA: Diagnosis not present

## 2021-03-05 DIAGNOSIS — N870 Mild cervical dysplasia: Secondary | ICD-10-CM | POA: Diagnosis not present

## 2021-03-05 DIAGNOSIS — Z1231 Encounter for screening mammogram for malignant neoplasm of breast: Secondary | ICD-10-CM | POA: Diagnosis not present

## 2021-03-05 DIAGNOSIS — Z01419 Encounter for gynecological examination (general) (routine) without abnormal findings: Secondary | ICD-10-CM | POA: Diagnosis not present

## 2021-03-05 DIAGNOSIS — N393 Stress incontinence (female) (male): Secondary | ICD-10-CM | POA: Diagnosis not present

## 2021-03-05 DIAGNOSIS — N812 Incomplete uterovaginal prolapse: Secondary | ICD-10-CM | POA: Diagnosis not present

## 2021-03-05 DIAGNOSIS — R69 Illness, unspecified: Secondary | ICD-10-CM | POA: Diagnosis not present

## 2021-03-15 DIAGNOSIS — H9202 Otalgia, left ear: Secondary | ICD-10-CM | POA: Diagnosis not present

## 2021-05-24 ENCOUNTER — Other Ambulatory Visit: Payer: Self-pay

## 2021-05-24 ENCOUNTER — Ambulatory Visit (INDEPENDENT_AMBULATORY_CARE_PROVIDER_SITE_OTHER): Payer: Medicare HMO | Admitting: Otolaryngology

## 2021-05-24 DIAGNOSIS — H9042 Sensorineural hearing loss, unilateral, left ear, with unrestricted hearing on the contralateral side: Secondary | ICD-10-CM | POA: Diagnosis not present

## 2021-05-24 DIAGNOSIS — H9120 Sudden idiopathic hearing loss, unspecified ear: Secondary | ICD-10-CM

## 2021-05-24 NOTE — Progress Notes (Signed)
HPI: Laura Baldwin is a 65 y.o. female who presents is referred by her PCP Dr. Cliffton Asters for evaluation of left ear fullness that she has noticed since the first part of this year.  She apparently had COVID requiring hospitalization back in January.  She had a minor earache at that time.  But since February or March she feels like the ears been stopped up.  She describes left ear fullness..  Past Medical History:  Diagnosis Date   Seizures (HCC)    Past Surgical History:  Procedure Laterality Date   SHOULDER ACROMIOPLASTY     TUBAL LIGATION     Social History   Socioeconomic History   Marital status: Married    Spouse name: Not on file   Number of children: Not on file   Years of education: Not on file   Highest education level: Not on file  Occupational History   Not on file  Tobacco Use   Smoking status: Never   Smokeless tobacco: Never  Substance and Sexual Activity   Alcohol use: Yes   Drug use: No   Sexual activity: Not on file  Other Topics Concern   Not on file  Social History Narrative   Not on file   Social Determinants of Health   Financial Resource Strain: Not on file  Food Insecurity: Not on file  Transportation Needs: Not on file  Physical Activity: Not on file  Stress: Not on file  Social Connections: Not on file   No family history on file. No Known Allergies Prior to Admission medications   Medication Sig Start Date End Date Taking? Authorizing Provider  aspirin 81 MG tablet Take 81 mg by mouth daily.    [provider]  meloxicam (MOBIC) 15 MG tablet Take 1 tablet (15 mg total) by mouth daily. 11/22/13   Alvan Dame, DPM  ondansetron (ZOFRAN ODT) 8 MG disintegrating tablet Take 1 tablet (8 mg total) by mouth every 8 (eight) hours as needed. 8mg  ODT q4 hours prn nausea 09/25/20   09/27/20, MD  pravastatin (PRAVACHOL) 40 MG tablet Take 40 mg by mouth daily.    [provider]  primidone (MYSOLINE) 250 MG tablet Take  250 mg by mouth 4 (four) times daily.    [provider]  vitamin B-12 (CYANOCOBALAMIN) 100 MCG tablet Take 100 mcg by mouth daily.    [provider]     Positive ROS: Otherwise negative  All other systems have been reviewed and were otherwise negative with the exception of those mentioned in the HPI and as above.  Physical Exam: Constitutional: Alert, well-appearing, no acute distress Ears: External ears without lesions or tenderness. Ear canals are clear bilaterally with intact, clear TMs bilaterally.  Both middle ear spaces were clear. Nasal: External nose without lesions. Septum with mild deformity and mild rhinitis.  Both middle meatus regions were clear.. Oral: Lips and gums without lesions. Tongue and palate mucosa without lesions. Posterior oropharynx clear. Neck: No palpable adenopathy or masses Respiratory: Breathing comfortably  Skin: No facial/neck lesions or rash noted.  Audiologic testing in the office today demonstrated a left ear sensorineural hearing loss with normal hearing in the right ear.  She had type A tympanograms bilaterally.  SRT's were 40 dB on the left and 15 dB on the right.  Procedures  Assessment: Sudden left ear sensorineural hearing loss that occurred 6 months ago.  Plan: We will plan on scheduling an MRI scan to evaluate for any cochlear or  retrocochlear pathology but I reviewed with her that this most likely represented a sudden sensorineural hearing loss and that the treatment of this would probably be use of a hearing aid. He go and schedule MRI scan and she will call us following results of the MRI scan   Narda Bonds, MD   CC:

## 2021-06-10 ENCOUNTER — Ambulatory Visit
Admission: RE | Admit: 2021-06-10 | Discharge: 2021-06-10 | Disposition: A | Discharge status: Home / Self Care | Payer: Medicare HMO | Source: Ambulatory Visit | Attending: Otolaryngology | Admitting: Otolaryngology

## 2021-06-10 DIAGNOSIS — H9042 Sensorineural hearing loss, unilateral, left ear, with unrestricted hearing on the contralateral side: Secondary | ICD-10-CM | POA: Diagnosis not present

## 2021-06-10 DIAGNOSIS — E559 Vitamin D deficiency, unspecified: Secondary | ICD-10-CM | POA: Diagnosis not present

## 2021-06-10 DIAGNOSIS — J341 Cyst and mucocele of nose and nasal sinus: Secondary | ICD-10-CM | POA: Diagnosis not present

## 2021-06-10 DIAGNOSIS — J329 Chronic sinusitis, unspecified: Secondary | ICD-10-CM | POA: Diagnosis not present

## 2021-06-10 DIAGNOSIS — I6782 Cerebral ischemia: Secondary | ICD-10-CM | POA: Diagnosis not present

## 2021-06-10 DIAGNOSIS — G40909 Epilepsy, unspecified, not intractable, without status epilepticus: Secondary | ICD-10-CM | POA: Diagnosis not present

## 2021-06-10 DIAGNOSIS — Z23 Encounter for immunization: Secondary | ICD-10-CM | POA: Diagnosis not present

## 2021-06-10 DIAGNOSIS — H9120 Sudden idiopathic hearing loss, unspecified ear: Secondary | ICD-10-CM

## 2021-06-10 DIAGNOSIS — E785 Hyperlipidemia, unspecified: Secondary | ICD-10-CM | POA: Diagnosis not present

## 2021-06-10 MED ORDER — GADOBENATE DIMEGLUMINE 529 MG/ML IV SOLN
14.0000 mL | Freq: Once | INTRAVENOUS | Status: AC | PRN
  Administered 2021-06-10: 14 mL via INTRAVENOUS

## 2021-06-15 ENCOUNTER — Telehealth (INDEPENDENT_AMBULATORY_CARE_PROVIDER_SITE_OTHER): Payer: Self-pay | Admitting: Otolaryngology

## 2021-06-15 NOTE — Telephone Encounter (Signed)
Called Laura Baldwin concerning results of her MRI scan to evaluate left ear hearing loss that she has had for several months.  On review of the MRI scan she had no cochlear or retrocochlear pathology noted. Reviewed the MRI scan with the patient on the phone and discussed with her that she has no significant pathology causing hearing problem on the left side. She would be a candidate for hearing aid if she is having difficulty with hearing on the left side.

## 2021-06-16 ENCOUNTER — Encounter (INDEPENDENT_AMBULATORY_CARE_PROVIDER_SITE_OTHER): Payer: Self-pay

## 2021-08-09 DIAGNOSIS — M810 Age-related osteoporosis without current pathological fracture: Secondary | ICD-10-CM | POA: Diagnosis not present

## 2021-08-09 DIAGNOSIS — Z008 Encounter for other general examination: Secondary | ICD-10-CM | POA: Diagnosis not present

## 2021-08-09 DIAGNOSIS — N393 Stress incontinence (female) (male): Secondary | ICD-10-CM | POA: Diagnosis not present

## 2021-08-09 DIAGNOSIS — E785 Hyperlipidemia, unspecified: Secondary | ICD-10-CM | POA: Diagnosis not present

## 2021-08-09 DIAGNOSIS — G40909 Epilepsy, unspecified, not intractable, without status epilepticus: Secondary | ICD-10-CM | POA: Diagnosis not present

## 2021-08-09 DIAGNOSIS — R030 Elevated blood-pressure reading, without diagnosis of hypertension: Secondary | ICD-10-CM | POA: Diagnosis not present

## 2021-11-08 DIAGNOSIS — E785 Hyperlipidemia, unspecified: Secondary | ICD-10-CM | POA: Diagnosis not present

## 2021-11-08 DIAGNOSIS — M810 Age-related osteoporosis without current pathological fracture: Secondary | ICD-10-CM | POA: Diagnosis not present

## 2022-03-22 DIAGNOSIS — Z8619 Personal history of other infectious and parasitic diseases: Secondary | ICD-10-CM | POA: Diagnosis not present

## 2022-03-22 DIAGNOSIS — Z9189 Other specified personal risk factors, not elsewhere classified: Secondary | ICD-10-CM | POA: Diagnosis not present

## 2022-03-22 DIAGNOSIS — Z01419 Encounter for gynecological examination (general) (routine) without abnormal findings: Secondary | ICD-10-CM | POA: Diagnosis not present

## 2022-03-22 DIAGNOSIS — M810 Age-related osteoporosis without current pathological fracture: Secondary | ICD-10-CM | POA: Diagnosis not present

## 2022-03-22 DIAGNOSIS — Z124 Encounter for screening for malignant neoplasm of cervix: Secondary | ICD-10-CM | POA: Diagnosis not present

## 2022-03-23 ENCOUNTER — Other Ambulatory Visit: Payer: Self-pay | Admitting: Obstetrics and Gynecology

## 2022-03-23 DIAGNOSIS — M810 Age-related osteoporosis without current pathological fracture: Secondary | ICD-10-CM

## 2022-04-11 ENCOUNTER — Ambulatory Visit
Admission: RE | Admit: 2022-04-11 | Discharge: 2022-04-11 | Disposition: A | Discharge status: Home / Self Care | Payer: Medicare HMO | Source: Ambulatory Visit | Attending: Obstetrics and Gynecology | Admitting: Obstetrics and Gynecology

## 2022-04-11 DIAGNOSIS — M8589 Other specified disorders of bone density and structure, multiple sites: Secondary | ICD-10-CM | POA: Diagnosis not present

## 2022-04-11 DIAGNOSIS — M810 Age-related osteoporosis without current pathological fracture: Secondary | ICD-10-CM

## 2022-04-11 DIAGNOSIS — Z780 Asymptomatic menopausal state: Secondary | ICD-10-CM | POA: Diagnosis not present

## 2022-06-17 DIAGNOSIS — E559 Vitamin D deficiency, unspecified: Secondary | ICD-10-CM | POA: Diagnosis not present

## 2022-06-17 DIAGNOSIS — Z23 Encounter for immunization: Secondary | ICD-10-CM | POA: Diagnosis not present

## 2022-06-17 DIAGNOSIS — G40909 Epilepsy, unspecified, not intractable, without status epilepticus: Secondary | ICD-10-CM | POA: Diagnosis not present

## 2022-06-17 DIAGNOSIS — E785 Hyperlipidemia, unspecified: Secondary | ICD-10-CM | POA: Diagnosis not present

## 2022-06-29 DIAGNOSIS — G40909 Epilepsy, unspecified, not intractable, without status epilepticus: Secondary | ICD-10-CM | POA: Diagnosis not present

## 2022-06-29 DIAGNOSIS — Z6830 Body mass index (BMI) 30.0-30.9, adult: Secondary | ICD-10-CM | POA: Diagnosis not present

## 2022-06-29 DIAGNOSIS — E669 Obesity, unspecified: Secondary | ICD-10-CM | POA: Diagnosis not present

## 2022-06-29 DIAGNOSIS — E785 Hyperlipidemia, unspecified: Secondary | ICD-10-CM | POA: Diagnosis not present

## 2022-08-02 DIAGNOSIS — H5203 Hypermetropia, bilateral: Secondary | ICD-10-CM | POA: Diagnosis not present

## 2022-08-02 DIAGNOSIS — H52223 Regular astigmatism, bilateral: Secondary | ICD-10-CM | POA: Diagnosis not present

## 2022-08-02 DIAGNOSIS — H524 Presbyopia: Secondary | ICD-10-CM | POA: Diagnosis not present

## 2022-08-02 DIAGNOSIS — H2513 Age-related nuclear cataract, bilateral: Secondary | ICD-10-CM | POA: Diagnosis not present

## 2023-01-07 DIAGNOSIS — R600 Localized edema: Secondary | ICD-10-CM | POA: Diagnosis not present

## 2023-01-07 DIAGNOSIS — Z823 Family history of stroke: Secondary | ICD-10-CM | POA: Diagnosis not present

## 2023-01-07 DIAGNOSIS — Z008 Encounter for other general examination: Secondary | ICD-10-CM | POA: Diagnosis not present

## 2023-01-07 DIAGNOSIS — I10 Essential (primary) hypertension: Secondary | ICD-10-CM | POA: Diagnosis not present

## 2023-01-07 DIAGNOSIS — M810 Age-related osteoporosis without current pathological fracture: Secondary | ICD-10-CM | POA: Diagnosis not present

## 2023-01-07 DIAGNOSIS — E785 Hyperlipidemia, unspecified: Secondary | ICD-10-CM | POA: Diagnosis not present

## 2023-01-07 DIAGNOSIS — M1990 Unspecified osteoarthritis, unspecified site: Secondary | ICD-10-CM | POA: Diagnosis not present

## 2023-01-07 DIAGNOSIS — G40909 Epilepsy, unspecified, not intractable, without status epilepticus: Secondary | ICD-10-CM | POA: Diagnosis not present

## 2023-01-07 DIAGNOSIS — E669 Obesity, unspecified: Secondary | ICD-10-CM | POA: Diagnosis not present

## 2023-01-07 DIAGNOSIS — H259 Unspecified age-related cataract: Secondary | ICD-10-CM | POA: Diagnosis not present

## 2023-01-07 DIAGNOSIS — K08409 Partial loss of teeth, unspecified cause, unspecified class: Secondary | ICD-10-CM | POA: Diagnosis not present

## 2023-01-07 DIAGNOSIS — Z8249 Family history of ischemic heart disease and other diseases of the circulatory system: Secondary | ICD-10-CM | POA: Diagnosis not present

## 2023-03-31 DIAGNOSIS — Z9889 Other specified postprocedural states: Secondary | ICD-10-CM | POA: Diagnosis not present

## 2023-03-31 DIAGNOSIS — Z8619 Personal history of other infectious and parasitic diseases: Secondary | ICD-10-CM | POA: Diagnosis not present

## 2023-03-31 DIAGNOSIS — Z9189 Other specified personal risk factors, not elsewhere classified: Secondary | ICD-10-CM | POA: Diagnosis not present

## 2023-03-31 DIAGNOSIS — M810 Age-related osteoporosis without current pathological fracture: Secondary | ICD-10-CM | POA: Diagnosis not present

## 2023-06-27 IMAGING — MR MR HEAD WO/W CM
15 series · 43 of 48 positions shown · IV contrast (multihance)
Comparison: Report from CT head 05/03/1999 (images unavailable).

CLINICAL DATA: Sudden onset sensorineural hearing loss.
Sensorineural hearing loss of left ear with unrestricted hearing of
right ear. Hearing loss, sudden onset, no focal neuro deficit.
Additional history provided by scanning technologist: Patient
reports left-sided hearing loss for 6 months.

EXAM:
MRI HEAD WITHOUT AND WITH CONTRAST
TECHNIQUE: Multiplanar, multiecho pulse sequences of the brain and surrounding
structures were obtained without and with intravenous contrast.
CONTRAST:  14mL MULTIHANCE GADOBENATE DIMEGLUMINE 529 MG/ML IV SOLN

[Series 2: T1 · sagittal · 5.0mm · 0.45mm/px · 3 of 21 slices shown (1 of 3)]
[im 1/21]
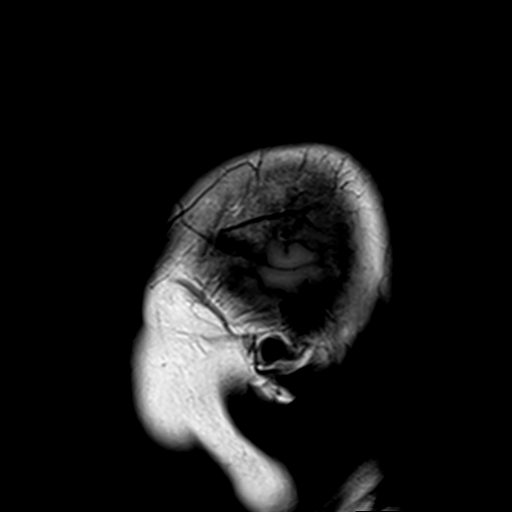
[im 11/21]
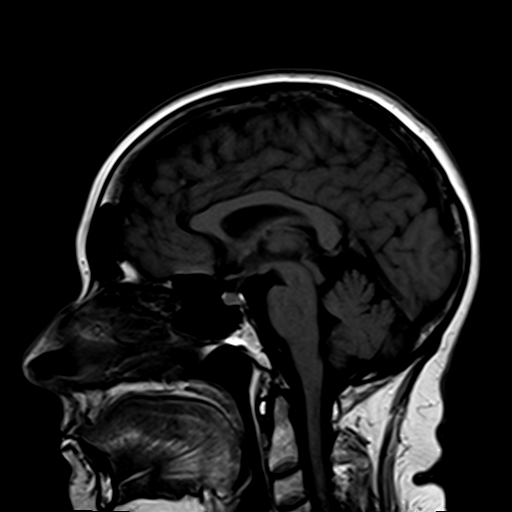
[im 21/21]
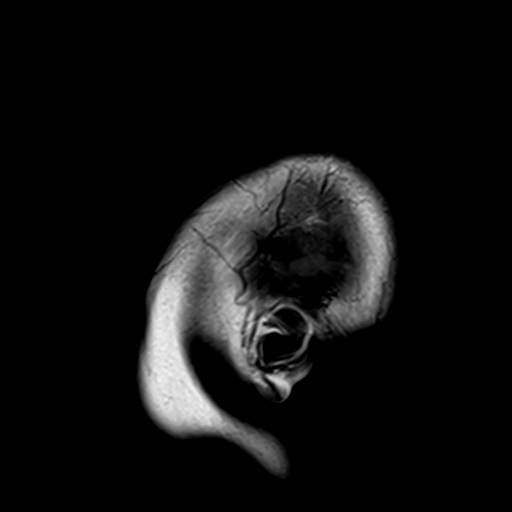

[Series 3: DWI · axial · 3.0mm · 1.80mm/px · z∈[-84,+63]mm · 9 of 100 slices shown (1 of 4)]
[im 1/100]
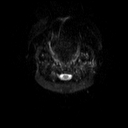
[im 13/100]
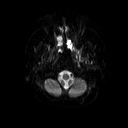
[im 25/100]
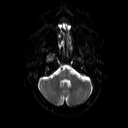
[im 38/100]
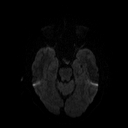
[im 50/100]
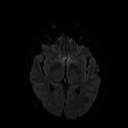
[im 62/100]
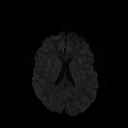
[im 75/100]
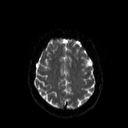
[im 87/100]
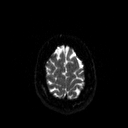
[im 100/100]
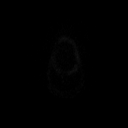

[Series 4: DWI · axial · 3.0mm · 1.80mm/px · z∈[-84,+63]mm · 4 of 49 slices shown (2 of 4)]
[im 1/49]
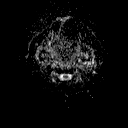
[im 17/49]
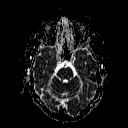
[im 33/49]
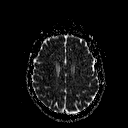
[im 49/49]
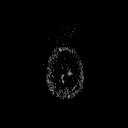

[Series 5: DWI · coronal · 5.0mm · 1.80mm/px · 6 of 66 slices shown (3 of 4)]
[im 1/66]
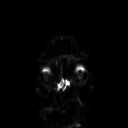
[im 14/66]
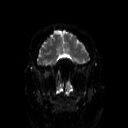
[im 27/66]
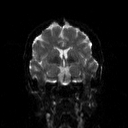
[im 40/66]
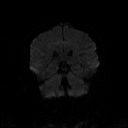
[im 53/66]
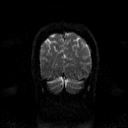
[im 66/66]
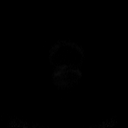

[Series 6: DWI · coronal · 5.0mm · 1.80mm/px · 3 of 34 slices shown (4 of 4)]
[im 1/34]
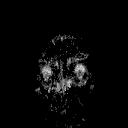
[im 17/34]
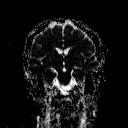
[im 34/34]
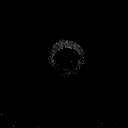

[Series 7: T2 · axial · 5.0mm · 0.60mm/px · z∈[-81,+61]mm · 2 of 22 slices shown]
[im 1/22]
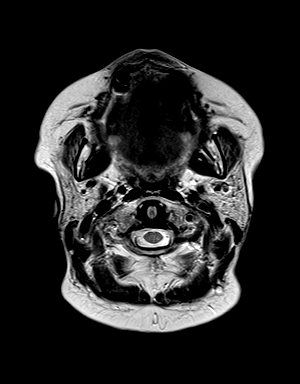
[im 22/22]
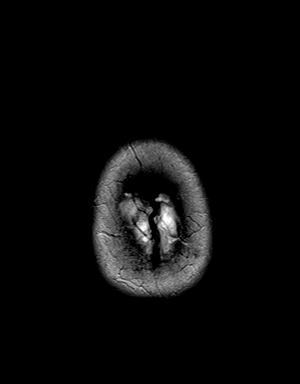

[Series 8: FLAIR · axial · 3.0mm · 0.45mm/px · z∈[-89,+67]mm · 2 of 27 slices shown (1 of 2)]
[im 1/27]
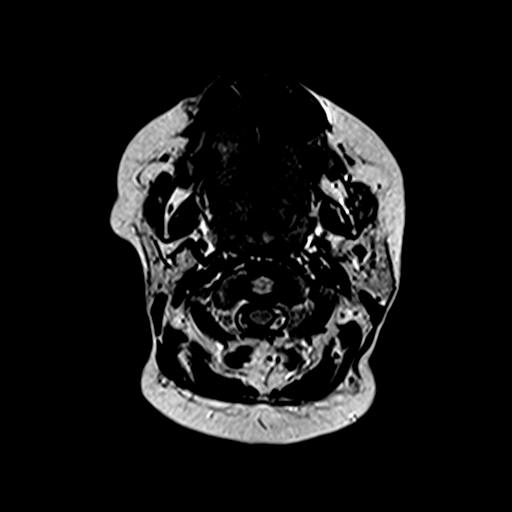
[im 27/27]
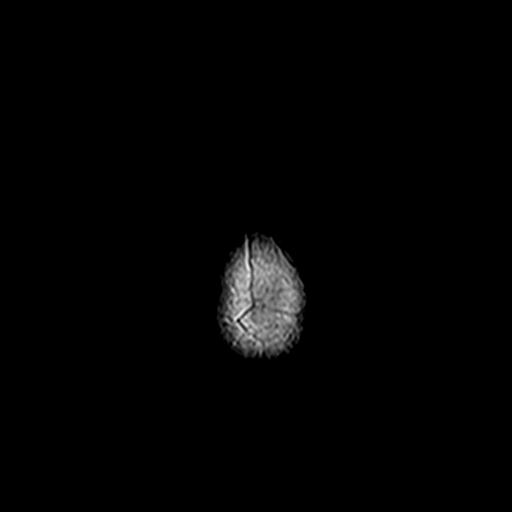

[Series 10: swi_images · axial · 3.0mm · 0.90mm/px · z∈[-80,+60]mm · 4 of 48 slices shown]
[im 1/48]
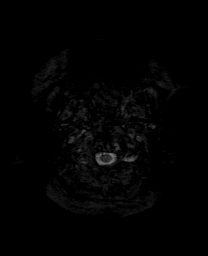
[im 16/48]
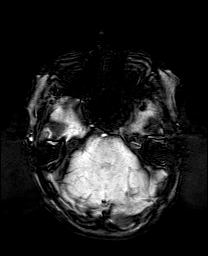
[im 32/48]
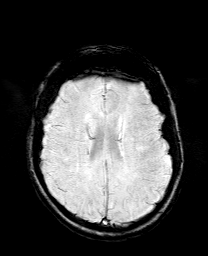
[im 48/48]
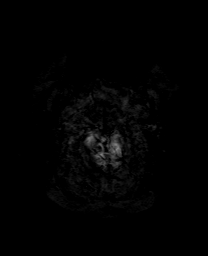

[Series 11: FLAIR · sagittal · 5.0mm · 0.45mm/px · 2 of 25 slices shown (2 of 2)]
[im 1/25]
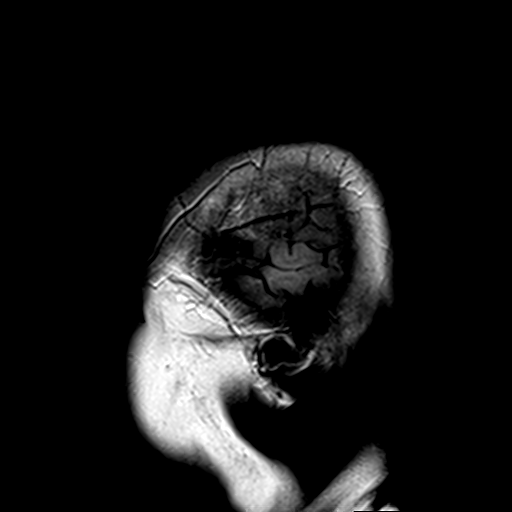
[im 25/25]
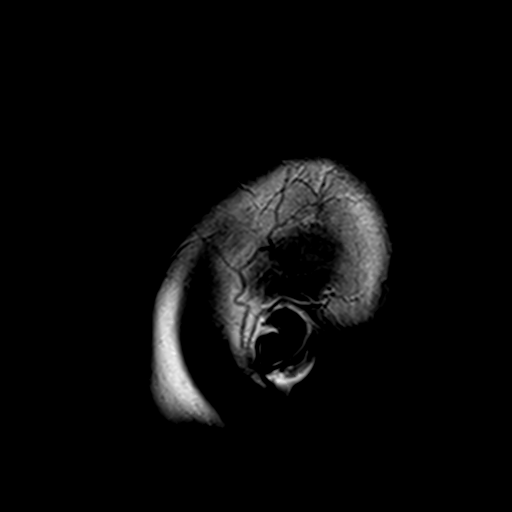

[Series 12: T1 · coronal · 3.0mm · 0.35mm/px · 1 of 11 slices shown (2 of 3)]
[im 1/11]
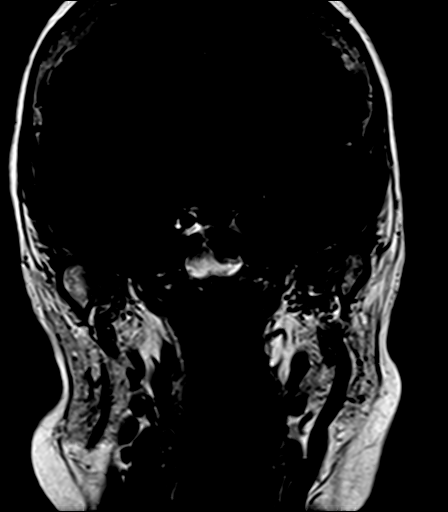

[Series 13: T1 · axial · 3.0mm · 0.35mm/px · 1 of 13 slices shown (3 of 3)]
[im 1/13]
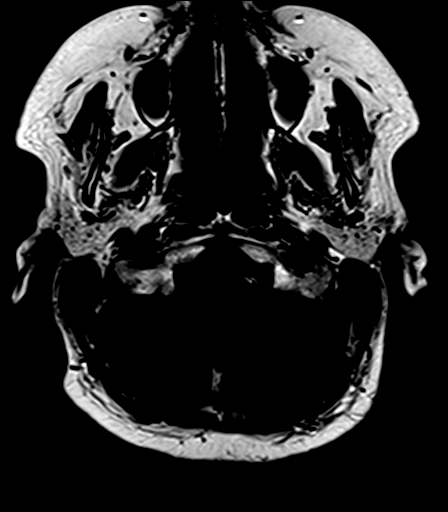

[Series 14: bSSFP · axial · 1.0mm · 0.28mm/px · z∈[-64,-29]mm · 3 of 36 slices shown]
[im 1/36]
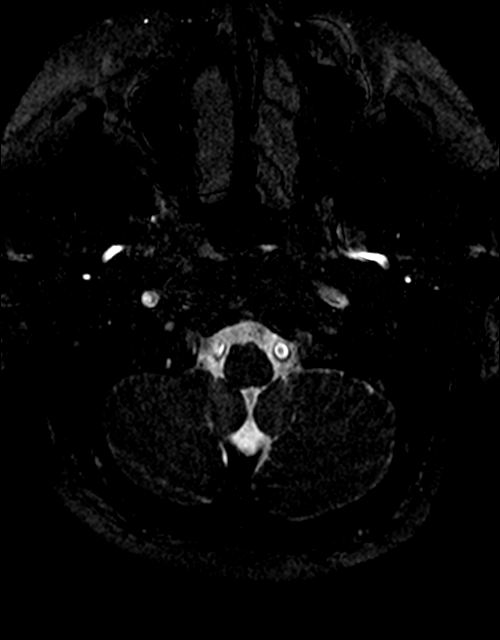
[im 18/36]
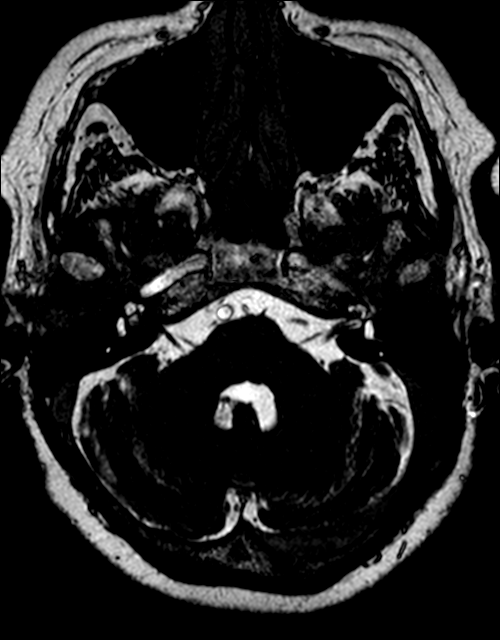
[im 36/36]
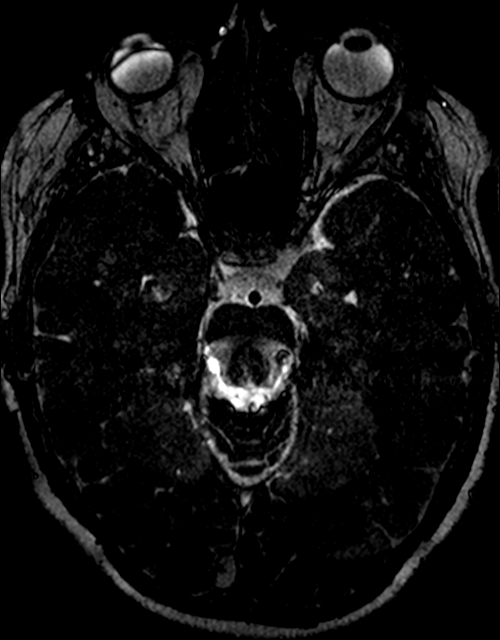

[Series 15: T1 post-contrast · coronal · 3.0mm · 0.35mm/px · 1 of 11 slices shown (1 of 2)]
[im 1/11]
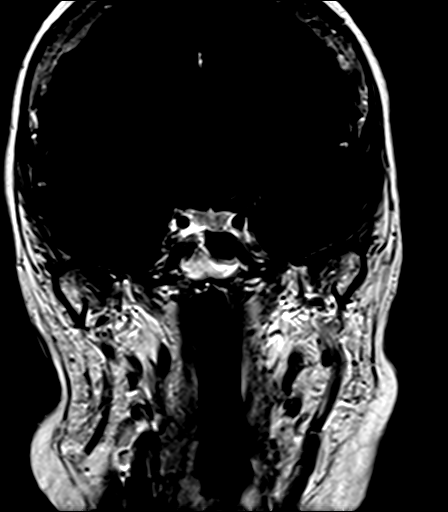

[Series 16: T1 post-contrast · axial · 3.0mm · 0.35mm/px · 1 of 13 slices shown (2 of 2)]
[im 1/13]
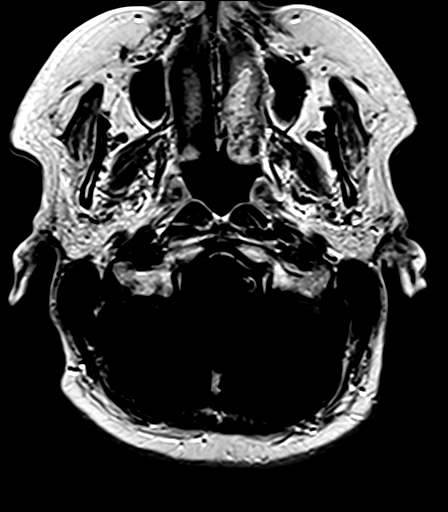

[Series 17: post_t1_mpr_tra · axial · 2.0mm · 0.45mm/px · 1 of 72 slices shown]
[im 1/72]
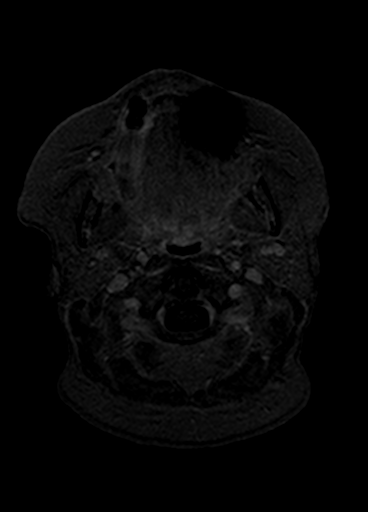

[43 of 48 positions shown; findings below may reference images not displayed]

FINDINGS: Brain:

Cerebral volume is normal.

Mild multifocal T2/FLAIR hyperintensity within the cerebral white
matter, nonspecific but compatible with chronic small vessel
ischemic disease.

No evidence of an intracranial mass. Specifically, no
cerebellopontine angle or internal auditory canal mass is
demonstrated. Unremarkable appearance of the seventh and eighth
cranial nerves bilaterally.

There is no acute infarct.

No chronic intracranial blood products.

No extra-axial fluid collection.

No midline shift.

No pathologic intracranial enhancement is identified.

Vascular: Maintained flow voids within the proximal large arterial
vessels.

Skull and upper cervical spine: No focal suspicious marrow lesion.

Sinuses/Orbits: Visualized orbits show no acute finding.
Small-volume frothy secretions within the right frontal sinus. Trace
bilateral ethmoid sinus mucosal thickening. 10 mm mucous retention
cyst within the right sphenoid sinus.
IMPRESSION: No evidence of acute intracranial abnormality.

No cerebellopontine angle or internal auditory canal mass.

Mild chronic small-vessel ischemic changes within the cerebral white
matter.

Paranasal sinus disease, as described.

## 2023-07-18 DIAGNOSIS — E785 Hyperlipidemia, unspecified: Secondary | ICD-10-CM | POA: Diagnosis not present

## 2023-07-18 DIAGNOSIS — E559 Vitamin D deficiency, unspecified: Secondary | ICD-10-CM | POA: Diagnosis not present

## 2023-07-18 DIAGNOSIS — G40909 Epilepsy, unspecified, not intractable, without status epilepticus: Secondary | ICD-10-CM | POA: Diagnosis not present

## 2023-10-10 DIAGNOSIS — E785 Hyperlipidemia, unspecified: Secondary | ICD-10-CM | POA: Diagnosis not present

## 2023-11-01 ENCOUNTER — Other Ambulatory Visit: Payer: Self-pay | Admitting: Family Medicine

## 2023-11-01 DIAGNOSIS — Z1231 Encounter for screening mammogram for malignant neoplasm of breast: Secondary | ICD-10-CM

## 2023-12-04 ENCOUNTER — Ambulatory Visit: Payer: Medicare HMO

## 2023-12-05 ENCOUNTER — Ambulatory Visit
Admission: RE | Admit: 2023-12-05 | Discharge: 2023-12-05 | Disposition: A | Discharge status: Home / Self Care | Source: Ambulatory Visit | Attending: Family Medicine | Admitting: Family Medicine

## 2023-12-05 DIAGNOSIS — Z1231 Encounter for screening mammogram for malignant neoplasm of breast: Secondary | ICD-10-CM

## 2024-04-04 DIAGNOSIS — L820 Inflamed seborrheic keratosis: Secondary | ICD-10-CM | POA: Diagnosis not present

## 2024-04-09 DIAGNOSIS — Z8742 Personal history of other diseases of the female genital tract: Secondary | ICD-10-CM | POA: Diagnosis not present

## 2024-04-09 DIAGNOSIS — Z9189 Other specified personal risk factors, not elsewhere classified: Secondary | ICD-10-CM | POA: Diagnosis not present

## 2024-04-23 DIAGNOSIS — Z823 Family history of stroke: Secondary | ICD-10-CM | POA: Diagnosis not present

## 2024-04-23 DIAGNOSIS — Z8616 Personal history of COVID-19: Secondary | ICD-10-CM | POA: Diagnosis not present

## 2024-04-23 DIAGNOSIS — G40909 Epilepsy, unspecified, not intractable, without status epilepticus: Secondary | ICD-10-CM | POA: Diagnosis not present

## 2024-04-23 DIAGNOSIS — E785 Hyperlipidemia, unspecified: Secondary | ICD-10-CM | POA: Diagnosis not present

## 2024-04-23 DIAGNOSIS — Z6830 Body mass index (BMI) 30.0-30.9, adult: Secondary | ICD-10-CM | POA: Diagnosis not present

## 2024-04-23 DIAGNOSIS — E669 Obesity, unspecified: Secondary | ICD-10-CM | POA: Diagnosis not present

## 2024-04-23 DIAGNOSIS — Z8249 Family history of ischemic heart disease and other diseases of the circulatory system: Secondary | ICD-10-CM | POA: Diagnosis not present

## 2024-04-23 DIAGNOSIS — M1990 Unspecified osteoarthritis, unspecified site: Secondary | ICD-10-CM | POA: Diagnosis not present

## 2024-04-23 DIAGNOSIS — N393 Stress incontinence (female) (male): Secondary | ICD-10-CM | POA: Diagnosis not present

## 2024-04-23 DIAGNOSIS — M810 Age-related osteoporosis without current pathological fracture: Secondary | ICD-10-CM | POA: Diagnosis not present

## 2024-04-23 DIAGNOSIS — H269 Unspecified cataract: Secondary | ICD-10-CM | POA: Diagnosis not present

## 2024-04-23 DIAGNOSIS — Z833 Family history of diabetes mellitus: Secondary | ICD-10-CM | POA: Diagnosis not present

## 2024-05-02 DIAGNOSIS — Z1283 Encounter for screening for malignant neoplasm of skin: Secondary | ICD-10-CM | POA: Diagnosis not present

## 2024-05-02 DIAGNOSIS — D225 Melanocytic nevi of trunk: Secondary | ICD-10-CM | POA: Diagnosis not present

## 2024-05-02 DIAGNOSIS — L820 Inflamed seborrheic keratosis: Secondary | ICD-10-CM | POA: Diagnosis not present

## 2024-07-24 ENCOUNTER — Other Ambulatory Visit (HOSPITAL_COMMUNITY): Payer: Self-pay | Admitting: Family Medicine

## 2024-07-24 DIAGNOSIS — E559 Vitamin D deficiency, unspecified: Secondary | ICD-10-CM | POA: Diagnosis not present

## 2024-07-24 DIAGNOSIS — E785 Hyperlipidemia, unspecified: Secondary | ICD-10-CM | POA: Diagnosis not present

## 2024-07-24 DIAGNOSIS — E538 Deficiency of other specified B group vitamins: Secondary | ICD-10-CM | POA: Diagnosis not present

## 2024-07-28 ENCOUNTER — Other Ambulatory Visit (HOSPITAL_BASED_OUTPATIENT_CLINIC_OR_DEPARTMENT_OTHER): Payer: Self-pay | Admitting: Family Medicine

## 2024-07-28 DIAGNOSIS — M810 Age-related osteoporosis without current pathological fracture: Secondary | ICD-10-CM

## 2024-08-26 ENCOUNTER — Ambulatory Visit (HOSPITAL_BASED_OUTPATIENT_CLINIC_OR_DEPARTMENT_OTHER)
Admission: RE | Admit: 2024-08-26 | Discharge: 2024-08-26 | Disposition: A | Payer: Self-pay | Source: Ambulatory Visit | Attending: Family Medicine | Admitting: Family Medicine

## 2024-08-26 DIAGNOSIS — E785 Hyperlipidemia, unspecified: Secondary | ICD-10-CM | POA: Insufficient documentation

## 2024-09-09 DIAGNOSIS — H5203 Hypermetropia, bilateral: Secondary | ICD-10-CM | POA: Diagnosis not present

## 2024-09-09 DIAGNOSIS — H52223 Regular astigmatism, bilateral: Secondary | ICD-10-CM | POA: Diagnosis not present

## 2024-09-09 DIAGNOSIS — H524 Presbyopia: Secondary | ICD-10-CM | POA: Diagnosis not present

## 2024-11-27 ENCOUNTER — Other Ambulatory Visit
